# Patient Record
Sex: Female | Born: 1955 | Race: Black or African American | Hispanic: No | Marital: Single | Smoking: Never smoker
Health system: Southern US, Community
[De-identification: ages and names within clinical notes are randomized; demographics above are authoritative.]

## PROBLEM LIST (undated history)

## (undated) DIAGNOSIS — I509 Heart failure, unspecified: Secondary | ICD-10-CM

## (undated) DIAGNOSIS — I82409 Acute embolism and thrombosis of unspecified deep veins of unspecified lower extremity: Secondary | ICD-10-CM

## (undated) DIAGNOSIS — J449 Chronic obstructive pulmonary disease, unspecified: Secondary | ICD-10-CM

## (undated) DIAGNOSIS — I10 Essential (primary) hypertension: Secondary | ICD-10-CM

## (undated) DIAGNOSIS — R0602 Shortness of breath: Secondary | ICD-10-CM

## (undated) DIAGNOSIS — D241 Benign neoplasm of right breast: Secondary | ICD-10-CM

## (undated) DIAGNOSIS — D509 Iron deficiency anemia, unspecified: Secondary | ICD-10-CM

## (undated) DIAGNOSIS — G459 Transient cerebral ischemic attack, unspecified: Secondary | ICD-10-CM

## (undated) DIAGNOSIS — E785 Hyperlipidemia, unspecified: Secondary | ICD-10-CM

## (undated) DIAGNOSIS — J45909 Unspecified asthma, uncomplicated: Secondary | ICD-10-CM

## (undated) DIAGNOSIS — Z531 Procedure and treatment not carried out because of patient's decision for reasons of belief and group pressure: Secondary | ICD-10-CM

## (undated) HISTORY — DX: Benign neoplasm of right breast: D24.1

## (undated) HISTORY — DX: Iron deficiency anemia, unspecified: D50.9

## (undated) HISTORY — PX: EYE SURGERY: SHX253

## (undated) HISTORY — DX: Acute embolism and thrombosis of unspecified deep veins of unspecified lower extremity: I82.409

---

## 2001-09-24 ENCOUNTER — Encounter: Payer: Self-pay | Admitting: Emergency Medicine

## 2001-09-24 ENCOUNTER — Emergency Department (HOSPITAL_COMMUNITY): Admission: EM | Admit: 2001-09-24 | Discharge: 2001-09-24 | Payer: Self-pay | Admitting: *Deleted

## 2002-06-02 ENCOUNTER — Ambulatory Visit (HOSPITAL_COMMUNITY): Admission: RE | Admit: 2002-06-02 | Discharge: 2002-06-02 | Payer: Self-pay | Admitting: Family Medicine

## 2002-06-02 ENCOUNTER — Encounter: Payer: Self-pay | Admitting: Family Medicine

## 2003-05-30 ENCOUNTER — Emergency Department (HOSPITAL_COMMUNITY): Admission: EM | Admit: 2003-05-30 | Discharge: 2003-05-30 | Payer: Self-pay | Admitting: Emergency Medicine

## 2003-08-23 ENCOUNTER — Encounter (INDEPENDENT_AMBULATORY_CARE_PROVIDER_SITE_OTHER): Payer: Self-pay | Admitting: Interventional Cardiology

## 2003-08-23 ENCOUNTER — Inpatient Hospital Stay (HOSPITAL_COMMUNITY): Admission: EM | Admit: 2003-08-23 | Discharge: 2003-08-26 | Payer: Self-pay | Admitting: Emergency Medicine

## 2004-06-23 ENCOUNTER — Inpatient Hospital Stay (HOSPITAL_COMMUNITY): Admission: EM | Admit: 2004-06-23 | Discharge: 2004-06-26 | Payer: Self-pay | Admitting: Emergency Medicine

## 2004-06-30 ENCOUNTER — Ambulatory Visit: Payer: Self-pay | Admitting: Family Medicine

## 2004-07-01 ENCOUNTER — Ambulatory Visit: Payer: Self-pay | Admitting: *Deleted

## 2004-07-14 ENCOUNTER — Ambulatory Visit: Payer: Self-pay | Admitting: Family Medicine

## 2004-07-29 ENCOUNTER — Emergency Department (HOSPITAL_COMMUNITY): Admission: EM | Admit: 2004-07-29 | Discharge: 2004-07-30 | Payer: Self-pay | Admitting: Emergency Medicine

## 2004-07-29 ENCOUNTER — Encounter (INDEPENDENT_AMBULATORY_CARE_PROVIDER_SITE_OTHER): Payer: Self-pay | Admitting: *Deleted

## 2004-08-16 ENCOUNTER — Emergency Department (HOSPITAL_COMMUNITY): Admission: EM | Admit: 2004-08-16 | Discharge: 2004-08-16 | Payer: Self-pay | Admitting: *Deleted

## 2004-08-26 ENCOUNTER — Ambulatory Visit: Payer: Self-pay | Admitting: Family Medicine

## 2004-08-29 ENCOUNTER — Ambulatory Visit (HOSPITAL_COMMUNITY): Admission: RE | Admit: 2004-08-29 | Discharge: 2004-08-29 | Payer: Self-pay | Admitting: Family Medicine

## 2004-09-01 ENCOUNTER — Ambulatory Visit: Payer: Self-pay | Admitting: Family Medicine

## 2004-09-23 DIAGNOSIS — I82409 Acute embolism and thrombosis of unspecified deep veins of unspecified lower extremity: Secondary | ICD-10-CM

## 2004-10-20 ENCOUNTER — Ambulatory Visit: Payer: Self-pay | Admitting: Family Medicine

## 2004-11-04 ENCOUNTER — Ambulatory Visit: Payer: Self-pay | Admitting: Family Medicine

## 2004-11-22 HISTORY — PX: APPENDECTOMY: SHX54

## 2004-11-22 HISTORY — PX: TOTAL ABDOMINAL HYSTERECTOMY W/ BILATERAL SALPINGOOPHORECTOMY: SHX83

## 2005-03-01 IMAGING — US US PELVIS COMPLETE
1 series · 14 of 25 positions shown · non-contrast
Comparison: none

CLINICAL DATA: Fever of unknown origin. 
 ULTRASOUND OF THE PELVIS: 
 Multiple longitudinal and transverse images were obtained. There is a large abdominal and pelvic mass with dystrophic calcification.  It is difficult to determine exactly which structure it arises from but I believe it arises from the uterus.  There are areas of presumed central necrosis.  Considerations would include degenerating fibroid vs. leiomyosarcoma. There is a moderate amount of free fluid now which was not present on the previous CT.  This study represents ascites, bleeding, or serous fluid arising from the mass.  Cross sectional measurements are approximately 21 x 20 x 14 cm.

[Series 1: unknown · 0.45mm/px · 14 of 45 slices shown]
[im 1/45]
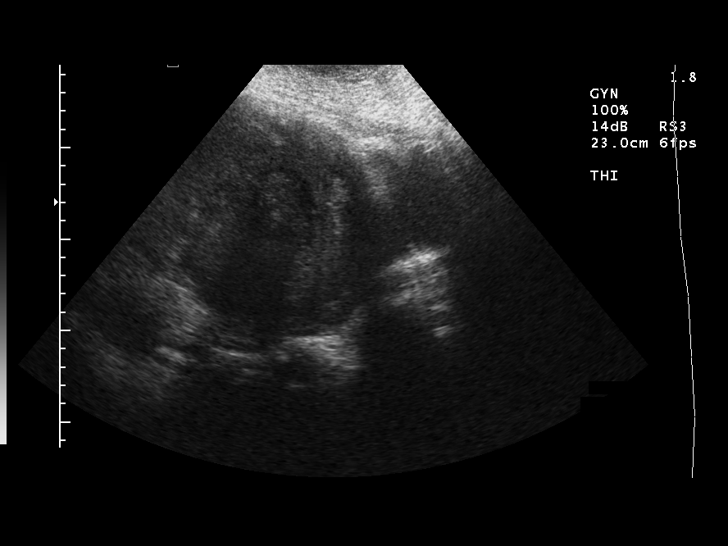
[im 4/45]
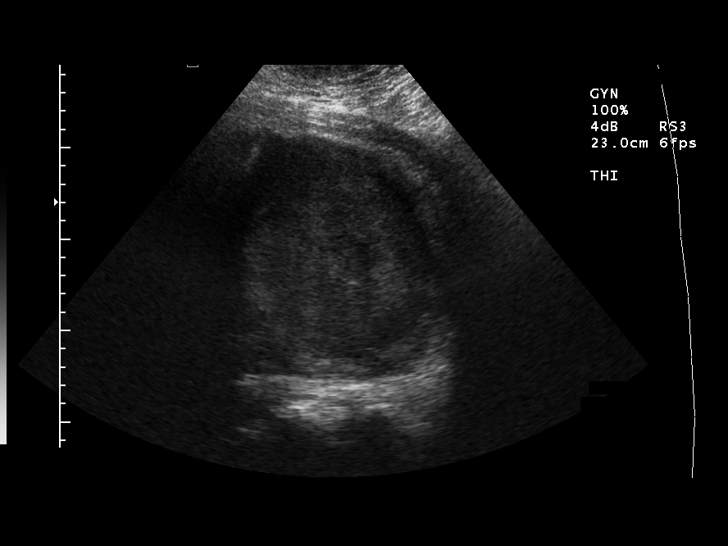
[im 8/45]
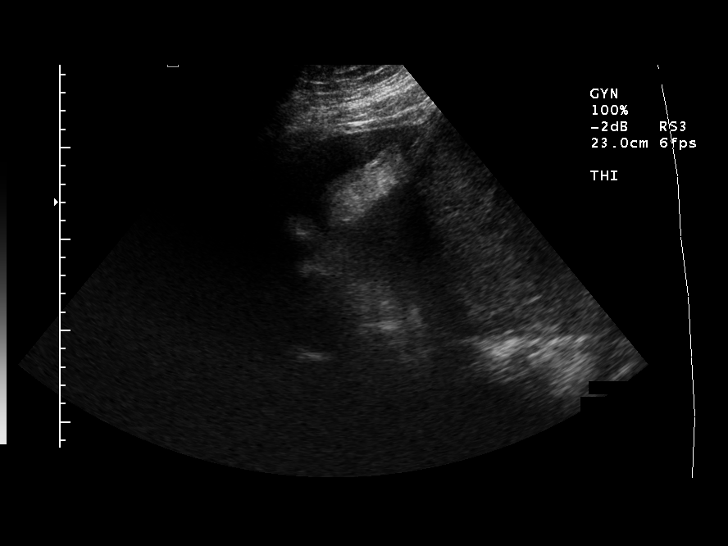
[im 12/45]
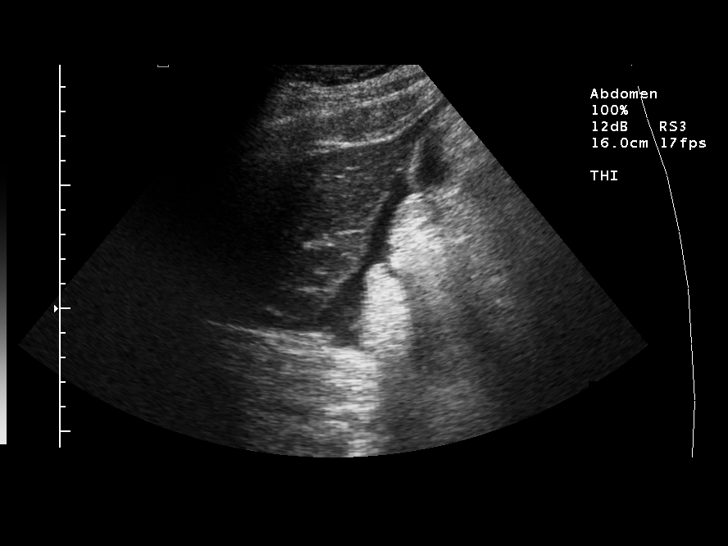
[im 15/45]
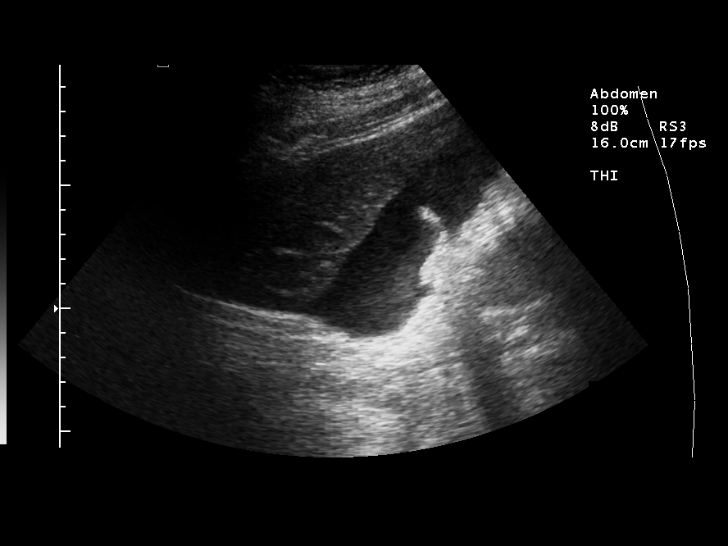
[im 17/45]
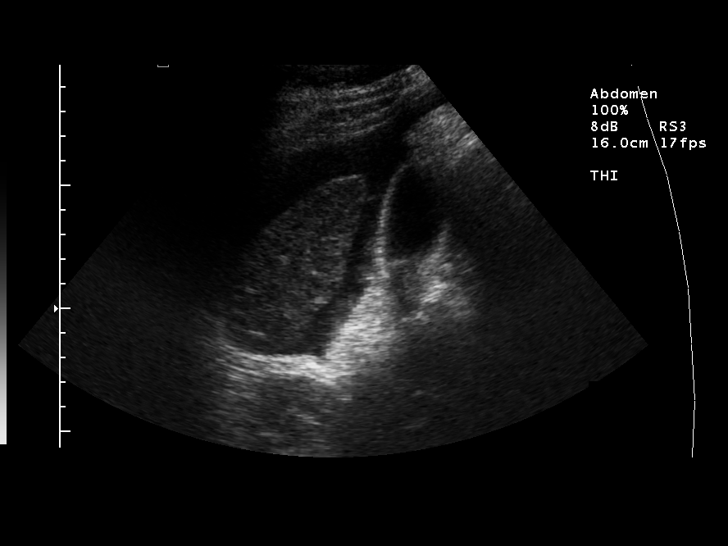
[im 21/45]
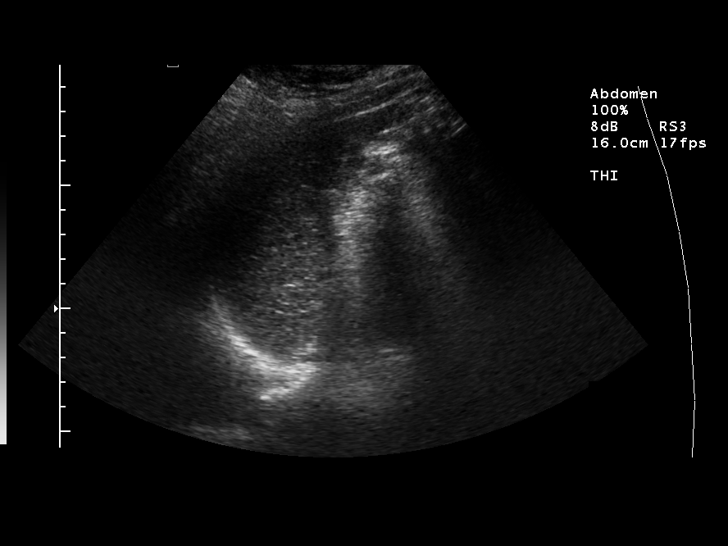
[im 24/45]
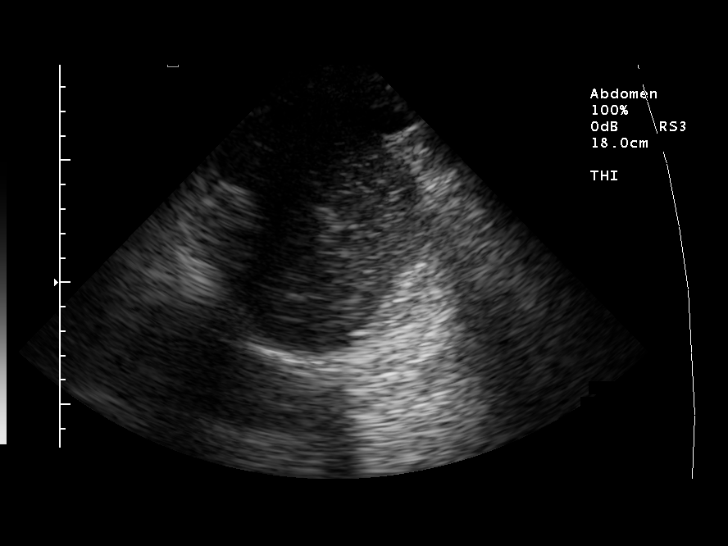
[im 28/45]
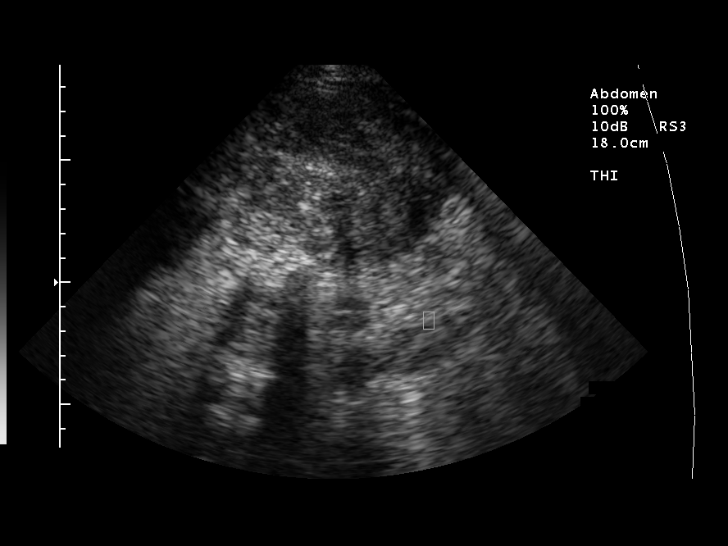
[im 30/45]
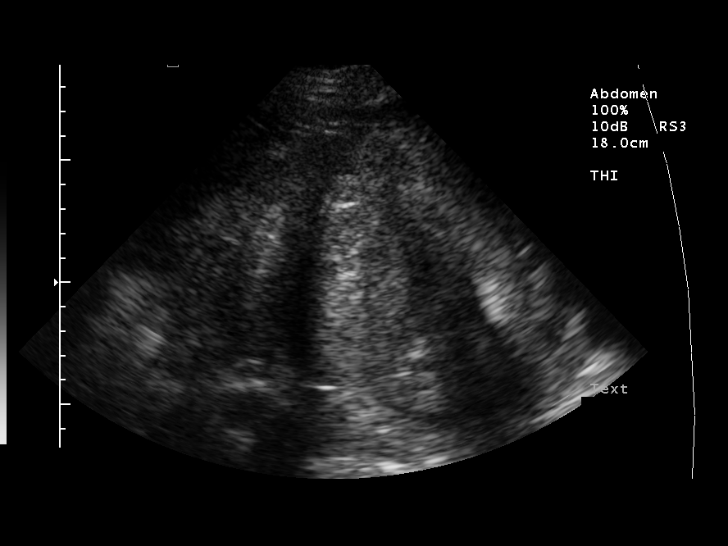
[im 34/45]
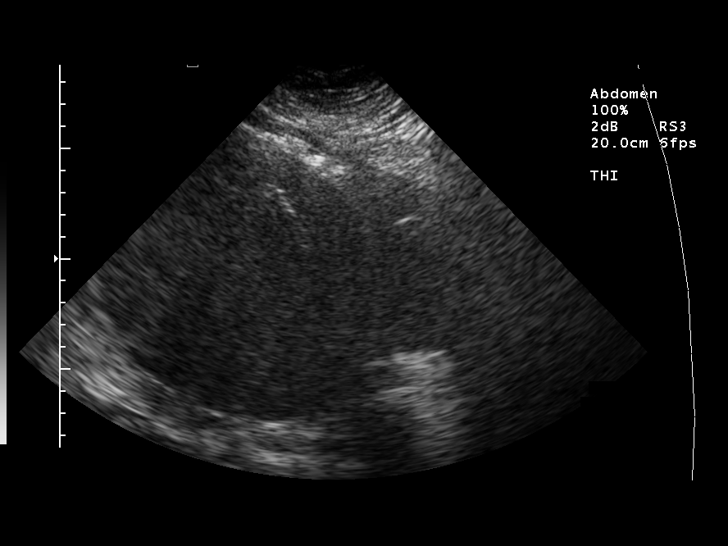
[im 37/45]
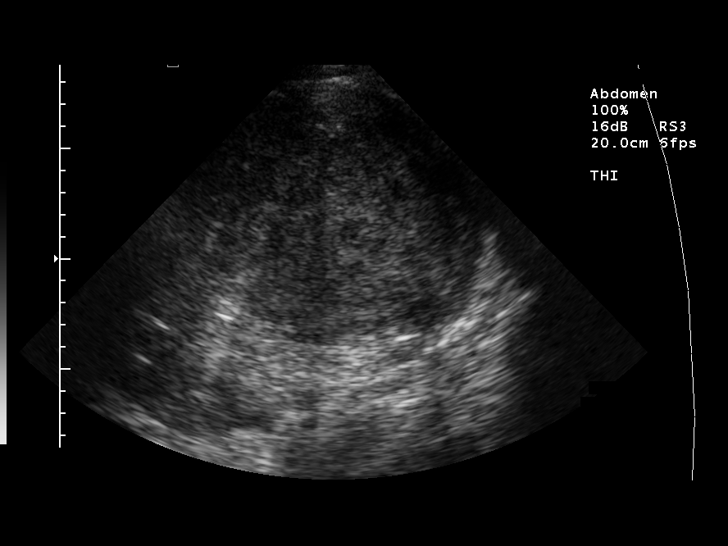
[im 41/45]
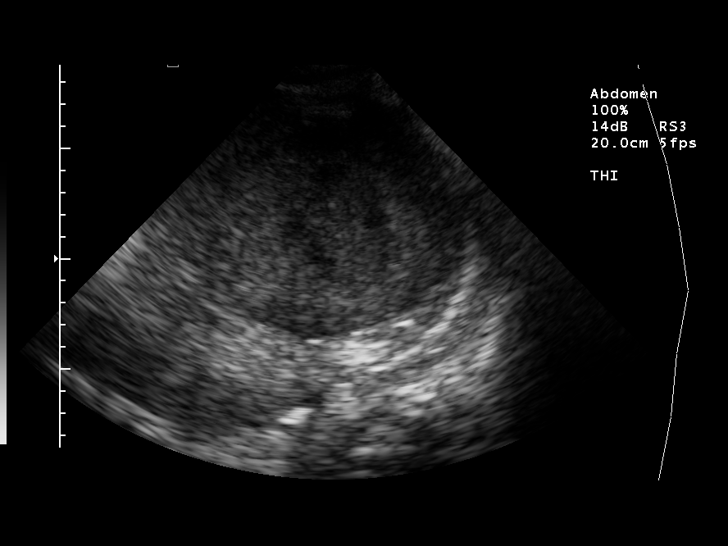
[im 45/45]
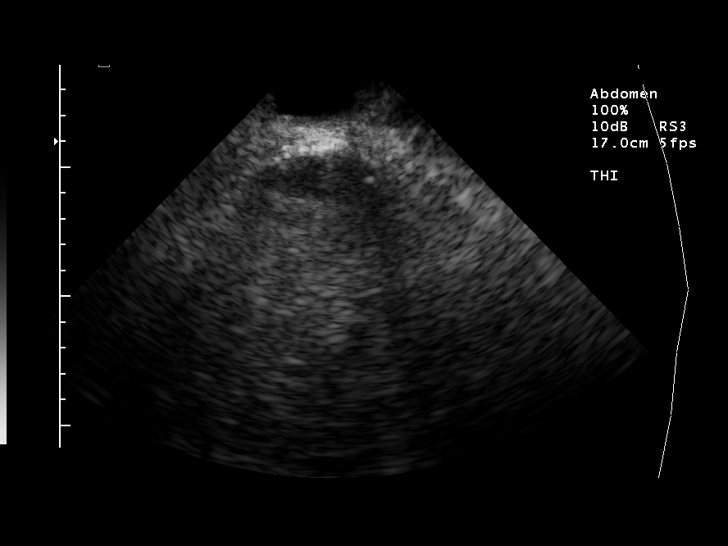

[14 of 25 positions shown; findings below may reference images not displayed]

IMPRESSION: Large abdominal and pelvic mass with calcification and presumed necrosis now associated with free fluid.  Correlate clinically.

## 2005-03-01 IMAGING — CR DG ABDOMEN ACUTE W/ 1V CHEST
3 series · 3 of 3 positions shown · non-contrast
Comparison: none

CLINICAL DATA: Weakness.  Abdominal pain. 
 ACUTE ABDOMINAL SERIES WITH CHEST:
 There are areas of calcification seen within the left abdomen which correspond to the areas of calcification associated with the large pelvic mass extending into the left abdomen (when correlated with the 06/24/04 CT).  The bowel gas pattern is normal.  There is no free peritoneal air.  Degenerative disc space narrowing is noted at the L4-5 level.  The upright chest view included with this study is normal.

[view not recorded (1 of 3)]
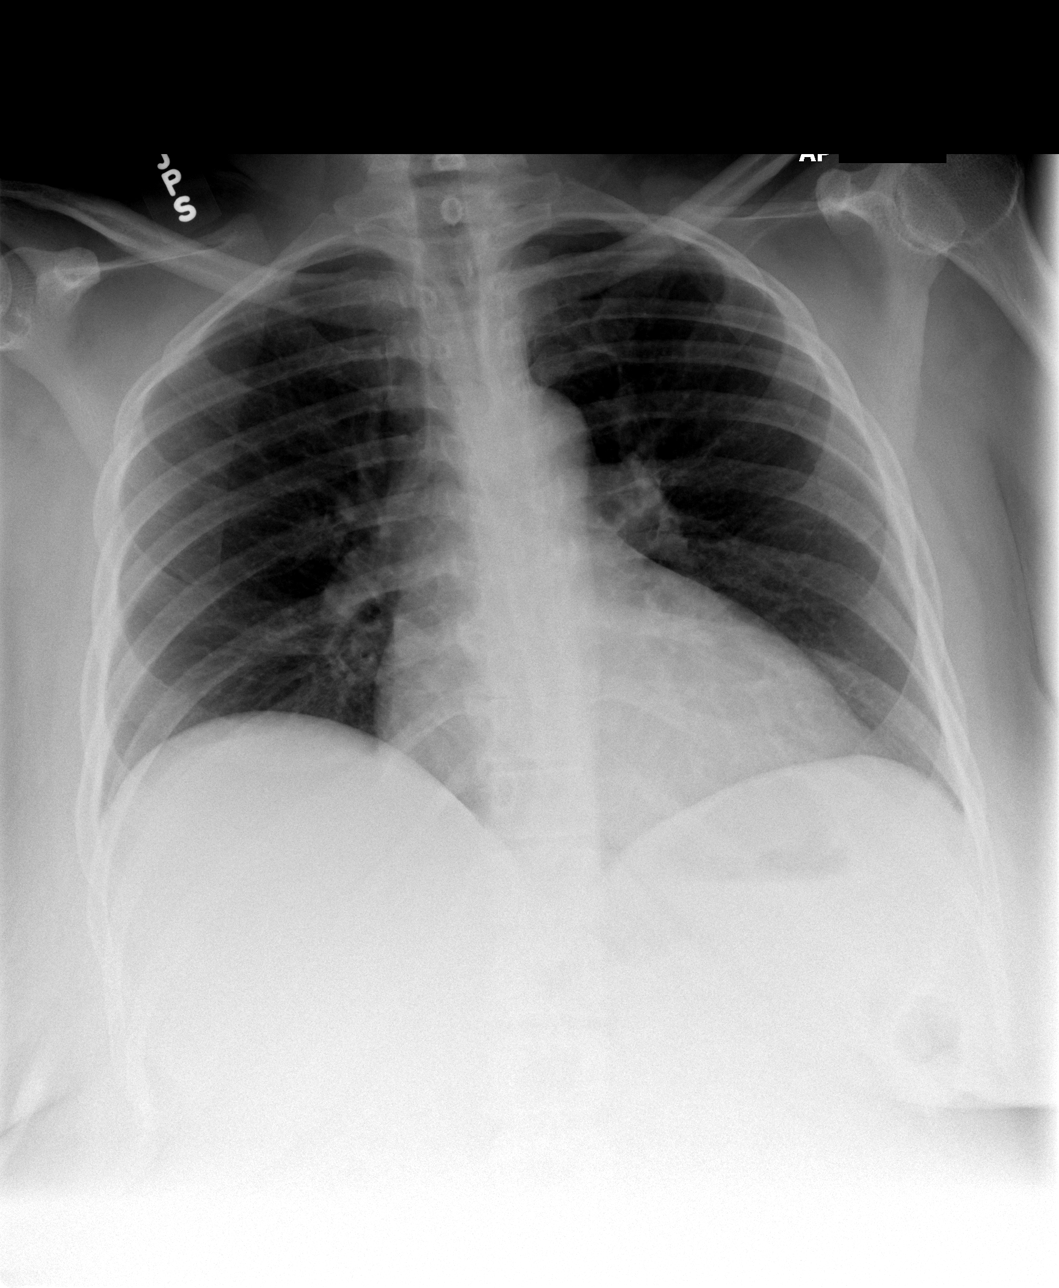

[view not recorded (2 of 3)]
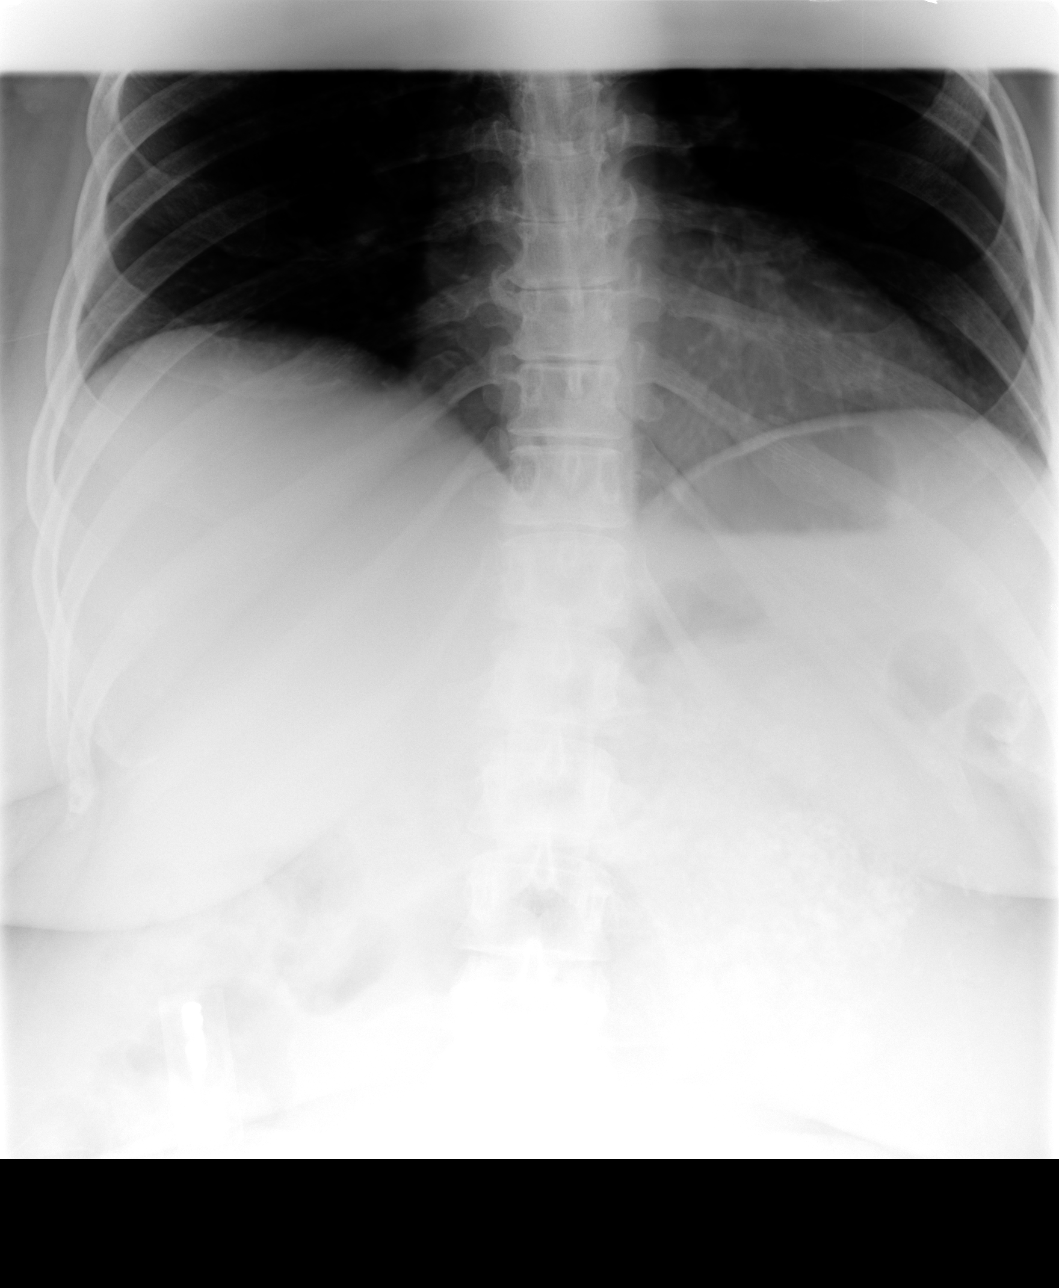

[view not recorded (3 of 3)]
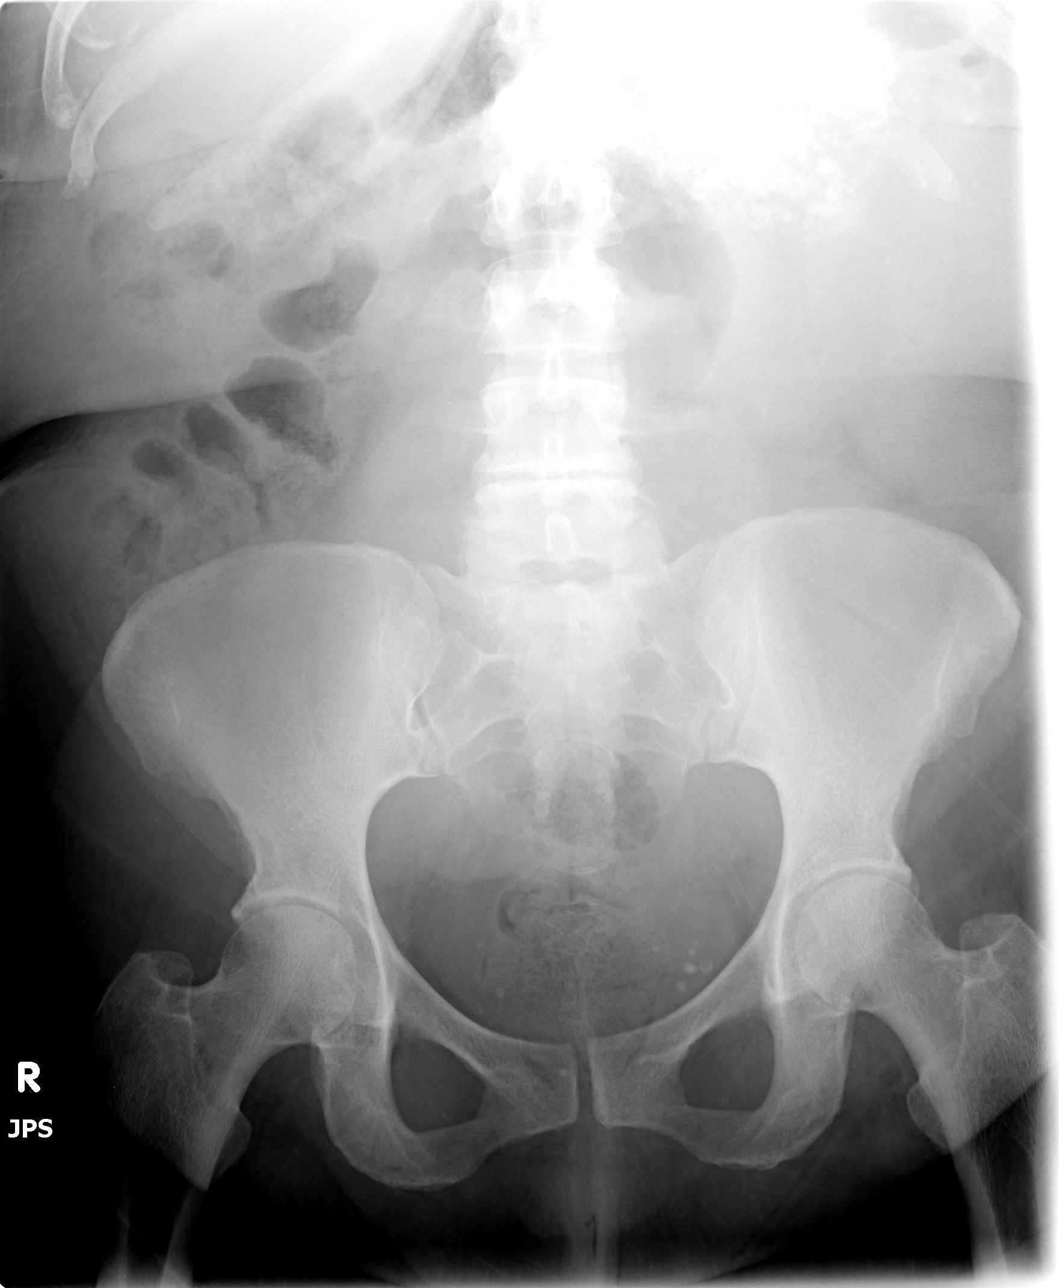

[3 of 3 positions shown; findings below may reference images not displayed]

IMPRESSION: Areas of calcification within the left abdomen related to the large mass extending from the pelvis into the abdomen (noted on the 06/24/04 CT of the abdomen and pelvis. 
 Normal bowel gas pattern.

## 2005-03-06 ENCOUNTER — Ambulatory Visit: Payer: Self-pay | Admitting: Family Medicine

## 2005-03-12 ENCOUNTER — Ambulatory Visit: Payer: Self-pay | Admitting: Family Medicine

## 2005-03-16 ENCOUNTER — Ambulatory Visit: Payer: Self-pay | Admitting: Family Medicine

## 2005-03-19 ENCOUNTER — Ambulatory Visit: Payer: Self-pay | Admitting: Family Medicine

## 2005-03-23 ENCOUNTER — Ambulatory Visit: Payer: Self-pay | Admitting: Family Medicine

## 2005-03-26 ENCOUNTER — Ambulatory Visit: Payer: Self-pay | Admitting: Family Medicine

## 2005-04-02 ENCOUNTER — Ambulatory Visit: Payer: Self-pay | Admitting: Family Medicine

## 2005-04-02 DIAGNOSIS — D509 Iron deficiency anemia, unspecified: Secondary | ICD-10-CM

## 2005-04-02 HISTORY — DX: Iron deficiency anemia, unspecified: D50.9

## 2005-04-13 ENCOUNTER — Ambulatory Visit: Payer: Self-pay | Admitting: Family Medicine

## 2005-04-17 ENCOUNTER — Ambulatory Visit: Payer: Self-pay | Admitting: Family Medicine

## 2005-05-14 ENCOUNTER — Ambulatory Visit: Payer: Self-pay | Admitting: Family Medicine

## 2005-06-11 ENCOUNTER — Ambulatory Visit: Payer: Self-pay | Admitting: Family Medicine

## 2005-08-17 ENCOUNTER — Emergency Department (HOSPITAL_COMMUNITY): Admission: EM | Admit: 2005-08-17 | Discharge: 2005-08-18 | Payer: Self-pay | Admitting: Emergency Medicine

## 2005-08-19 ENCOUNTER — Emergency Department (HOSPITAL_COMMUNITY): Admission: EM | Admit: 2005-08-19 | Discharge: 2005-08-19 | Payer: Self-pay | Admitting: Family Medicine

## 2005-08-20 ENCOUNTER — Emergency Department (HOSPITAL_COMMUNITY): Admission: EM | Admit: 2005-08-20 | Discharge: 2005-08-20 | Payer: Self-pay | Admitting: Emergency Medicine

## 2005-08-22 ENCOUNTER — Emergency Department (HOSPITAL_COMMUNITY): Admission: EM | Admit: 2005-08-22 | Discharge: 2005-08-22 | Payer: Self-pay | Admitting: Emergency Medicine

## 2007-12-07 ENCOUNTER — Inpatient Hospital Stay (HOSPITAL_COMMUNITY): Admission: EM | Admit: 2007-12-07 | Discharge: 2007-12-10 | Payer: Self-pay | Admitting: Emergency Medicine

## 2008-10-24 ENCOUNTER — Emergency Department (HOSPITAL_COMMUNITY): Admission: EM | Admit: 2008-10-24 | Discharge: 2008-10-24 | Payer: Self-pay | Admitting: Emergency Medicine

## 2009-02-08 ENCOUNTER — Emergency Department (HOSPITAL_COMMUNITY): Admission: EM | Admit: 2009-02-08 | Discharge: 2009-02-08 | Payer: Self-pay | Admitting: Emergency Medicine

## 2009-02-18 ENCOUNTER — Ambulatory Visit: Payer: Self-pay | Admitting: Vascular Surgery

## 2009-02-18 ENCOUNTER — Encounter (INDEPENDENT_AMBULATORY_CARE_PROVIDER_SITE_OTHER): Payer: Self-pay | Admitting: Emergency Medicine

## 2009-02-18 ENCOUNTER — Ambulatory Visit: Admission: RE | Admit: 2009-02-18 | Discharge: 2009-02-18 | Payer: Self-pay | Admitting: Emergency Medicine

## 2009-02-18 ENCOUNTER — Emergency Department (HOSPITAL_COMMUNITY): Admission: EM | Admit: 2009-02-18 | Discharge: 2009-02-18 | Payer: Self-pay | Admitting: Emergency Medicine

## 2009-02-21 DIAGNOSIS — D241 Benign neoplasm of right breast: Secondary | ICD-10-CM

## 2009-02-21 HISTORY — DX: Benign neoplasm of right breast: D24.1

## 2009-07-18 ENCOUNTER — Emergency Department (HOSPITAL_COMMUNITY): Admission: EM | Admit: 2009-07-18 | Discharge: 2009-07-19 | Payer: Self-pay | Admitting: Emergency Medicine

## 2009-11-22 ENCOUNTER — Inpatient Hospital Stay (HOSPITAL_COMMUNITY): Admission: EM | Admit: 2009-11-22 | Discharge: 2009-11-24 | Payer: Self-pay | Admitting: Emergency Medicine

## 2010-03-02 ENCOUNTER — Emergency Department (HOSPITAL_COMMUNITY): Admission: EM | Admit: 2010-03-02 | Discharge: 2010-03-02 | Payer: Self-pay | Admitting: Emergency Medicine

## 2010-04-29 ENCOUNTER — Emergency Department (HOSPITAL_COMMUNITY): Admission: EM | Admit: 2010-04-29 | Discharge: 2010-04-29 | Payer: Self-pay | Admitting: Emergency Medicine

## 2010-09-01 ENCOUNTER — Emergency Department (HOSPITAL_COMMUNITY)
Admission: EM | Admit: 2010-09-01 | Discharge: 2010-09-02 | Payer: Self-pay | Source: Home / Self Care | Admitting: Emergency Medicine

## 2010-10-07 ENCOUNTER — Emergency Department (HOSPITAL_COMMUNITY): Payer: Medicaid Other

## 2010-10-07 ENCOUNTER — Emergency Department (HOSPITAL_COMMUNITY)
Admission: EM | Admit: 2010-10-07 | Discharge: 2010-10-07 | Disposition: A | Discharge status: Home / Self Care | Payer: Medicaid Other | Attending: Emergency Medicine | Admitting: Emergency Medicine

## 2010-10-07 DIAGNOSIS — J45909 Unspecified asthma, uncomplicated: Secondary | ICD-10-CM | POA: Insufficient documentation

## 2010-10-07 DIAGNOSIS — R0602 Shortness of breath: Secondary | ICD-10-CM | POA: Insufficient documentation

## 2010-10-07 DIAGNOSIS — E119 Type 2 diabetes mellitus without complications: Secondary | ICD-10-CM | POA: Insufficient documentation

## 2010-10-07 DIAGNOSIS — I10 Essential (primary) hypertension: Secondary | ICD-10-CM | POA: Insufficient documentation

## 2010-10-07 DIAGNOSIS — Z794 Long term (current) use of insulin: Secondary | ICD-10-CM | POA: Insufficient documentation

## 2010-10-12 ENCOUNTER — Emergency Department (HOSPITAL_COMMUNITY)
Admission: EM | Admit: 2010-10-12 | Discharge: 2010-10-12 | Disposition: A | Discharge status: Home / Self Care | Payer: Medicaid Other | Attending: Emergency Medicine | Admitting: Emergency Medicine

## 2010-10-12 DIAGNOSIS — E7800 Pure hypercholesterolemia, unspecified: Secondary | ICD-10-CM | POA: Insufficient documentation

## 2010-10-12 DIAGNOSIS — R0602 Shortness of breath: Secondary | ICD-10-CM | POA: Insufficient documentation

## 2010-10-12 DIAGNOSIS — I10 Essential (primary) hypertension: Secondary | ICD-10-CM | POA: Insufficient documentation

## 2010-10-12 DIAGNOSIS — R059 Cough, unspecified: Secondary | ICD-10-CM | POA: Insufficient documentation

## 2010-10-12 DIAGNOSIS — J45901 Unspecified asthma with (acute) exacerbation: Secondary | ICD-10-CM | POA: Insufficient documentation

## 2010-10-12 DIAGNOSIS — E119 Type 2 diabetes mellitus without complications: Secondary | ICD-10-CM | POA: Insufficient documentation

## 2010-10-12 DIAGNOSIS — R05 Cough: Secondary | ICD-10-CM | POA: Insufficient documentation

## 2010-10-12 LAB — BASIC METABOLIC PANEL
BUN: 19 mg/dL (ref 6–23)
CO2: 27 mEq/L (ref 19–32)
Calcium: 9.7 mg/dL (ref 8.4–10.5)
Chloride: 98 mEq/L (ref 96–112)
Creatinine, Ser: 1.26 mg/dL — ABNORMAL HIGH (ref 0.4–1.2)
GFR calc Af Amer: 53 mL/min — ABNORMAL LOW (ref >60)
Glucose, Bld: 235 mg/dL — ABNORMAL HIGH (ref 70–99)
Sodium: 135 mEq/L (ref 135–145)

## 2010-10-12 LAB — CBC
MCH: 28.2 pg (ref 26.0–34.0)
Platelets: 317 10*3/uL (ref 150–400)
RBC: 4.61 MIL/uL (ref 3.87–5.11)
WBC: 11 10*3/uL — ABNORMAL HIGH (ref 4.0–10.5)

## 2010-10-12 LAB — DIFFERENTIAL
Basophils Absolute: 0 10*3/uL (ref 0.0–0.1)
Basophils Relative: 0 % (ref 0–1)
Eosinophils Absolute: 0.7 10*3/uL (ref 0.0–0.7)
Eosinophils Relative: 6 % — ABNORMAL HIGH (ref 0–5)
Lymphs Abs: 3.8 10*3/uL (ref 0.7–4.0)
Monocytes Relative: 6 % (ref 3–12)
Neutrophils Relative %: 53 % (ref 43–77)

## 2010-10-12 LAB — BRAIN NATRIURETIC PEPTIDE: Pro B Natriuretic peptide (BNP): <30 pg/mL (ref 0.0–100.0)

## 2010-11-06 LAB — POCT CARDIAC MARKERS
CKMB, poc: 4.6 ng/mL (ref 1.0–8.0)
Myoglobin, poc: 241 ng/mL (ref 12–200)
Troponin i, poc: <0.05 ng/mL (ref 0.00–0.09)

## 2010-11-06 LAB — POCT I-STAT, CHEM 8
BUN: 20 mg/dL (ref 6–23)
Calcium, Ion: 1.04 mmol/L — ABNORMAL LOW (ref 1.12–1.32)
Chloride: 103 mEq/L (ref 96–112)
Creatinine, Ser: 1.4 mg/dL — ABNORMAL HIGH (ref 0.4–1.2)
Glucose, Bld: 140 mg/dL — ABNORMAL HIGH (ref 70–99)
HCT: 40 % (ref 36.0–46.0)
Hemoglobin: 13.6 g/dL (ref 12.0–15.0)
Potassium: 4.6 mEq/L (ref 3.5–5.1)
Sodium: 140 mEq/L (ref 135–145)
TCO2: 32 mmol/L (ref 0–100)

## 2010-11-12 LAB — BASIC METABOLIC PANEL
BUN: 15 mg/dL (ref 6–23)
BUN: 21 mg/dL (ref 6–23)
BUN: 29 mg/dL — ABNORMAL HIGH (ref 6–23)
CO2: 27 mEq/L (ref 19–32)
CO2: 29 mEq/L (ref 19–32)
Calcium: 8.6 mg/dL (ref 8.4–10.5)
Calcium: 8.8 mg/dL (ref 8.4–10.5)
Chloride: 102 mEq/L (ref 96–112)
Chloride: 96 mEq/L (ref 96–112)
Creatinine, Ser: 1.06 mg/dL (ref 0.4–1.2)
Creatinine, Ser: 1.2 mg/dL (ref 0.4–1.2)
GFR calc Af Amer: 56 mL/min — ABNORMAL LOW (ref >60)
GFR calc Af Amer: >60 mL/min (ref >60)
GFR calc non Af Amer: 46 mL/min — ABNORMAL LOW (ref >60)
GFR calc non Af Amer: 54 mL/min — ABNORMAL LOW (ref >60)
Glucose, Bld: 123 mg/dL — ABNORMAL HIGH (ref 70–99)
Glucose, Bld: 343 mg/dL — ABNORMAL HIGH (ref 70–99)
Potassium: 4.3 mEq/L (ref 3.5–5.1)
Potassium: 4.5 mEq/L (ref 3.5–5.1)
Sodium: 133 mEq/L — ABNORMAL LOW (ref 135–145)
Sodium: 137 mEq/L (ref 135–145)

## 2010-11-12 LAB — GLUCOSE, CAPILLARY
Glucose-Capillary: 458 mg/dL — ABNORMAL HIGH (ref 70–99)
Glucose-Capillary: 540 mg/dL — ABNORMAL HIGH (ref 70–99)

## 2010-11-12 LAB — CBC
HCT: 32.8 % — ABNORMAL LOW (ref 36.0–46.0)
HCT: 33.5 % — ABNORMAL LOW (ref 36.0–46.0)
HCT: 34.7 % — ABNORMAL LOW (ref 36.0–46.0)
Hemoglobin: 11 g/dL — ABNORMAL LOW (ref 12.0–15.0)
Hemoglobin: 11 g/dL — ABNORMAL LOW (ref 12.0–15.0)
MCHC: 31.6 g/dL (ref 30.0–36.0)
MCHC: 31.8 g/dL (ref 30.0–36.0)
MCHC: 32.7 g/dL (ref 30.0–36.0)
MCV: 88.1 fL (ref 78.0–100.0)
MCV: 88.2 fL (ref 78.0–100.0)
Platelets: 352 10*3/uL (ref 150–400)
Platelets: 355 10*3/uL (ref 150–400)
Platelets: 369 10*3/uL (ref 150–400)
RBC: 3.94 MIL/uL (ref 3.87–5.11)
RDW: 14.7 % (ref 11.5–15.5)
RDW: 14.9 % (ref 11.5–15.5)
RDW: 15 % (ref 11.5–15.5)
WBC: 11.2 10*3/uL — ABNORMAL HIGH (ref 4.0–10.5)
WBC: 15.1 10*3/uL — ABNORMAL HIGH (ref 4.0–10.5)

## 2010-11-12 LAB — CARDIAC PANEL(CRET KIN+CKTOT+MB+TROPI)
CK, MB: 5.8 ng/mL — ABNORMAL HIGH (ref 0.3–4.0)
CK, MB: 6 ng/mL — ABNORMAL HIGH (ref 0.3–4.0)
Relative Index: 0.7 (ref 0.0–2.5)
Total CK: 661 U/L — ABNORMAL HIGH (ref 7–177)
Total CK: 811 U/L — ABNORMAL HIGH (ref 7–177)
Total CK: 813 U/L — ABNORMAL HIGH (ref 7–177)
Troponin I: 0.02 ng/mL (ref 0.00–0.06)
Troponin I: 0.03 ng/mL (ref 0.00–0.06)
Troponin I: 0.04 ng/mL (ref 0.00–0.06)

## 2010-11-12 LAB — DIFFERENTIAL
Basophils Relative: 0 % (ref 0–1)
Lymphs Abs: 3.4 10*3/uL (ref 0.7–4.0)
Monocytes Absolute: 0.6 10*3/uL (ref 0.1–1.0)
Monocytes Relative: 5 % (ref 3–12)
Neutro Abs: 6.6 10*3/uL (ref 1.7–7.7)
Neutrophils Relative %: 59 % (ref 43–77)

## 2010-11-12 LAB — POCT CARDIAC MARKERS
CKMB, poc: 5 ng/mL (ref 1.0–8.0)
Myoglobin, poc: 206 ng/mL (ref 12–200)

## 2010-11-12 LAB — GLUCOSE, RANDOM: Glucose, Bld: 552 mg/dL (ref 70–99)

## 2010-11-12 LAB — HEMOGLOBIN A1C: Hgb A1c MFr Bld: 8.7 % — ABNORMAL HIGH (ref 4.6–6.1)

## 2010-11-12 LAB — BRAIN NATRIURETIC PEPTIDE: Pro B Natriuretic peptide (BNP): 85.8 pg/mL (ref 0.0–100.0)

## 2010-11-23 ENCOUNTER — Emergency Department (HOSPITAL_COMMUNITY): Payer: Medicaid Other

## 2010-11-23 ENCOUNTER — Emergency Department (HOSPITAL_COMMUNITY)
Admission: EM | Admit: 2010-11-23 | Discharge: 2010-11-23 | Disposition: A | Discharge status: Home / Self Care | Payer: Medicaid Other | Attending: Emergency Medicine | Admitting: Emergency Medicine

## 2010-11-23 DIAGNOSIS — Z794 Long term (current) use of insulin: Secondary | ICD-10-CM | POA: Insufficient documentation

## 2010-11-23 DIAGNOSIS — E119 Type 2 diabetes mellitus without complications: Secondary | ICD-10-CM | POA: Insufficient documentation

## 2010-11-23 DIAGNOSIS — I10 Essential (primary) hypertension: Secondary | ICD-10-CM | POA: Insufficient documentation

## 2010-11-23 DIAGNOSIS — J45909 Unspecified asthma, uncomplicated: Secondary | ICD-10-CM | POA: Insufficient documentation

## 2010-12-01 LAB — GLUCOSE, CAPILLARY: Glucose-Capillary: 111 mg/dL — ABNORMAL HIGH (ref 70–99)

## 2011-01-06 NOTE — H&P (Signed)
[name], [name] NO.:  [id number]   MEDICAL RECORD NO.:  [id number]          PATIENT TYPE:  INP   LOCATION:  0104                         FACILITY:  [facility]   PHYSICIAN:  Della Goo, M.D. DATE OF BIRTH:  [birth year]   DATE OF ADMISSION:  12/06/2007  DATE OF DISCHARGE:                              HISTORY & PHYSICAL   PRIMARY CARE PHYSICIAN:  Unassigned.   CHIEF COMPLAINT:  Shortness of breath.   HISTORY OF PRESENT ILLNESS:  This is a 55 year old female presenting to  the emergency department secondary to worsening shortness of breath.  She reports having cough and chest congestion and wheezes for 2 weeks.  She also reports having fevers, chills off and on.  She reports coughing  up yellowish phlegm.  She denies having any nausea, vomiting, chest  pain, syncope, dizziness.   PAST MEDICAL HISTORY:  1. Hypertension.  2. Insulin-dependent diabetes mellitus.  3. Asthma.   PAST SURGICAL HISTORY:  History of a total abdominal hysterectomy.   MEDICATIONS:  1. Norvasc 5 mg one p.o. daily.  2. Toprol XL 100 mg one p.o. daily.  3. Accupril 40 mg one p.o. daily.  4. Dyazide 37.5/25 mg one p.o. daily.  5. Proventil inhaler 2 puffs q.i.d. p.r.n.  6. Lantus insulin 85 units subcutaneous nightly.  7. NovoLog regular insulin 4 units subcutaneous t.i.d. with meals.   ALLERGIES:  AMOXICILLIN.   SOCIAL HISTORY:  The patient is a nonsmoker, nondrinker.   FAMILY HISTORY:  Positive for hypertension in her brother and both  parents.  Positive for diabetes mellitus in her mother.  Positive for  coronary artery disease in both of her parents and positive for cancer  in her father, who had bone cancer.   REVIEW OF SYSTEMS:  Pertinents are mentioned above.   PHYSICAL EXAMINATION FINDINGS:  This is a 55 year old morbidly obese  female in discomfort and mild distress.  VITAL SIGNS:  Temperature 99.2, blood pressure 139/70, heart rate 97,  respirations 22, O2  saturation 93-94% on 2 L nasal cannula oxygen.  HEENT:  Normocephalic, atraumatic.  Pupils equally round, reactive to  light.  Extraocular muscles are intact.  Funduscopic benign.  Oropharynx  is clear.  NECK:  Supple, full range of motion.  No thyromegaly, adenopathy,  jugulovenous distention.  CARDIOVASCULAR:  Regular rate and rhythm.  No murmurs, gallops or rubs.  LUNGS:  Lungs with decreased breath sounds and diffuse expiratory  wheezes.  ABDOMEN:  Positive bowel sounds, soft, nontender, nondistended.  EXTREMITIES:  Without cyanosis, clubbing or edema.  NEUROLOGIC:  Alert and oriented x3.  There are no focal deficits.   LABORATORY STUDIES:  White blood cell count 12.1, hemoglobin 12.1,  hematocrit 37.8, platelets 290, MCV 84.3, neutrophils 68% lymphocytes  22%.  Chemistry with a sodium of 129, potassium 3.8, chloride 93, bicarb  24, BUN 29, creatinine 1.61 and glucose 365.  Chest x-ray findings  reveal left greater than right patchy infiltrate and infrahilar airspace  disease opacity suggestive of atelectasis versus pneumonia.   ASSESSMENT:  A 55 year old female being admitted with:   1. Shortness of breath.  2. Pneumonia.  3. Asthma exacerbation.  4. Hypoxemia.  5. Hyperglycemia.  6. Mild dehydration.  7. Chronic renal insufficiency with acute renal failure.  8. Morbid obesity.   PLAN:  The patient will be admitted to a step-down unit area for  cardiopulmonary monitoring.  Supplemental oxygen has been ordered along  with IV fluids and IV antibiotic therapy of Avelox.  The patient will be  placed on an IV steroid taper.  Her regular medications will be  continued and sliding scale insulin coverage has also been ordered.  DVT  and GI prophylaxis have also been ordered.      Della Goo, M.D.  Electronically Signed     HJ/MEDQ  D:  12/07/2007  T:  12/07/2007  Job:  [id number]

## 2011-01-06 NOTE — Discharge Summary (Signed)
[name], [name] NO.:  [id number]   MEDICAL RECORD NO.:  [id number]          PATIENT TYPE:  INP   LOCATION:  1507                         FACILITY:  [facility]   PHYSICIAN:  Altha Harm, MDDATE OF BIRTH:  09-Nov-1955   DATE OF ADMISSION:  12/06/2007  DATE OF DISCHARGE:  12/10/2007                               DISCHARGE SUMMARY   DISCHARGE DISPOSITION:  Home.   FINAL DISCHARGE DIAGNOSES:  1. Community-acquired pneumonia.  2. Acute exacerbation of asthma.  3. Diabetes type 2.  Blood sugars previously uncontrolled, now      controlled.  4. Hypertension.   DISCHARGE MEDICATIONS:  1. Accupril 40 mg p.o. daily.  2. Norvasc 5 mg p.o. daily.  3. Dyazide 37.5/25 mg p.o. daily.  4. Toprol XL 100 mg p.o. daily.  5. Lantus 95 units subcu q.h.s.  6. NovoLog 4 units subcu a.c. meals.  7. Prednisone taper.  8. Doxycycline 100 mg p.o. b.i.d. x3 days.   CONSULTANTS:  None.   PROCEDURES:  None.   DIAGNOSTIC STUDIES:  Chest x-ray 2-view which shows left-greater-than-  right opacity, patchy infiltrate air space opacity with shallow lung  volumes.  Differential includes atelectasis versus pneumonia or  aspiration.   CODE STATUS:  Full code.   ALLERGIES:  No known drug allergies.   CHIEF COMPLAINT:  Shortness of breath.   HISTORY OF PRESENT ILLNESS:  Please see the H&P by Dr. Lovell Sheehan for  details of the HPI; however, in short, this is a 55 year old lady with a  history of asthma who presented with shortness of breath and wheezing  for 2 weeks.  The patient also had been feverish with chills  intermittently.   HOSPITAL COURSE:  1. Pneumonia.  The patient was treated with Avelox while hospitalized      and will be discharged on doxycycline for an additional 3 days to      complete a course of 7 days of antibiotics.  The patient was      initially hypoxic and the patient has now resolved her hypoxia.      She is 90% on room air resting and 89% on room air  with ambulation.  2. Acute exacerbation of asthma secondary to number one.  The patient      has been treated with beta agonist and prednisone.  She is being      sent home with a prednisone taper.  She is to continue her      Proventil inhaler at home as needed.  3. Hypertension.  Blood pressures were well-controlled while      hospitalized.  4. Diabetes type 2 with hyperglycemia.  Due to the use of steroids in      the treatment of the patient's condition, the patient's blood      sugars escalated.  The patient had her insulin adjusted from 85      units to 95 units subcu q.h.s.  The patient checks her blood sugars      at home and has been instructed to adjust her Lantus back down to  85 after she is off of the steroids.   FOLLOWUP:  The patient is to follow up with her primary care physician  at [facility] in 1 week.   DIETARY RESTRICTIONS:  The patient should be on a heart-healthy diabetic  diet.   PHYSICAL RESTRICTIONS:  None.      Altha Harm, MD  Electronically Signed     MAM/MEDQ  D:  12/10/2007  T:  12/10/2007  Job:  [id number]

## 2011-01-09 NOTE — H&P (Signed)
[name], [name]                           ACCOUNT NO.:  [id number]   MEDICAL RECORD NO.:  [id number]                   PATIENT TYPE:  EMS   LOCATION:  ED                                   FACILITY:  [facility]   PHYSICIAN:  Corinna L. Lendell Caprice, MD             DATE OF BIRTH:  02-05-1956   DATE OF ADMISSION:  08/23/2003  DATE OF DISCHARGE:                                HISTORY & PHYSICAL   CHIEF COMPLAINT:  Shortness of breath and leg swelling.   HISTORY OF PRESENT ILLNESS:  [name] is a 55 year old black female who had  not seen a doctor for several years who presents to the emergency room with  complaints of shortness of breath and leg swelling. This has been going on  for the past week. Her dyspnea is mainly on exertion. She has no history of  heart disease or congestive heart failure. She has had no chest pain. She  denies orthopnea or paroxysmal nocturnal dyspnea. She has a history of  diabetes but stopped taking her medicine about a year ago. She was noted to  have high blood pressure in the past but apparently was never started on any  antihypertensive medications.   PAST MEDICAL HISTORY:  Type 2 diabetes, uterine fibroids.   MEDICATIONS:  None.   ALLERGIES:  None.   SOCIAL HISTORY:  The patient does not smoke or drink or use drugs.   FAMILY HISTORY:  Her mother had congestive heart failure. Her father died of  bone cancer.   REVIEW OF SYSTEMS:  CONSTITUTIONAL:  No fevers, chills, weight loss. HEENT:  No headache. No sore throat. RESPIRATORY:  As above. CARDIOVASCULAR:  As  above. GASTROINTESTINAL:  No history of bleeding ulcers. No melena or  hematochezia. GENITOURINARY:  She suffers from heavy periods and has been  bleeding for the past 2 weeks. She was told in the past that she had uterine  fibroids. She has had a 1 single abnormal pap smear many years ago but  subsequent pap smears have been normal. She has not had a recent pap smear  for years. MUSCULOSKELETAL:   No arthralgias or myalgias. SKIN:  No rash.  ENDOCRINE:  As above. No history of thyroid disease. NEUROLOGIC:  No  seizures. PSYCHIATRIC:  No depression.   PHYSICAL EXAMINATION:  VITAL SIGNS:  Temperature normal. Blood pressure  210/95. Heart rate 102. Respiratory rate initially was 34. Currently it is  24.  GENERAL:  The patient is an overweight black female in no acute distress.  HEENT:  Normocephalic and atraumatic. Pupils are equal, round, and reactive  to light. Sclera nonicteric. She has poor dentition.  NECK:  Supple. Difficult to assess for jugular venous distention. She has no  thyromegaly and no carotid bruits.  LUNGS:  Clear to auscultation bilaterally without wheezes, rhonchi, or  rales.  CARDIOVASCULAR:  Regular rate and rhythm. Without murmur, rub, or gallop.  ABDOMEN:  Obese, soft, nontender.  GU/RECTAL:  Deferred.  EXTREMITIES:  She has 1+ pitting edema, left greater than right. Pulses are  intact.  SKIN: No rash.  PSYCHIATRIC:  Normal affect.  NEUROLOGIC:  Alert and oriented. Cranial nerves and sensory motor  examination are intact.   LABORATORY DATA:  Brain natriuretic peptide is 115. CBC is significant for a  hemoglobin of 6.7, hematocrit of 21.8. MCV is 72.8. Coagulation panel  normal. Comprehensive metabolic panel is significant for a glucose of 158,  otherwise essentially unremarkable.   EKG shows normal sinus rhythm.   Chest x-ray shows cardiomegaly and congestive heart failure.   PVL of the left leg showed no deep vein thrombosis.   ASSESSMENT/PLAN:  1. Congestive heart failure possibly diastolic dysfunction. The patient has     been given Lasix and has already diuresed several liters. She is feeling     better. She will be admitted to telemetry. I will check an echocardiogram     and rule out  myocardial infarction. She will get a TSH drawn. Blood     pressure control is key here. She will get Nitro paste and ace inhibitors     and p.r.n. Clonidine.  Certainly, her anemia could be contributing. She     refused blood transfusion, as she is Jehovah's Witness.  2. Microcytic anemia, most likely iron deficiency, secondary to menorrhagia.     The patient will get a ferritin drawn and I will start iron therapy. The     patient is still bleeding and I will give Lo-Ovral t.i.d. to stop the     bleeding. She will need followup with gynecology as an outpatient.  3. Untreated type 2 diabetes. I will check a hemoglobin A1C and CBG's. The     patient will be on a low salt, ADA diet.  4. Accelerated hypertension. See above.                                               Corinna L. Lendell Caprice, MD    CLS/MEDQ  D:  08/23/2003  T:  08/23/2003  Job:  [id number]

## 2011-01-09 NOTE — H&P (Signed)
[name], [name]               ACCOUNT NO.:  [id number]   MEDICAL RECORD NO.:  [id number]          PATIENT TYPE:  INP   LOCATION:  0377                         FACILITY:  [facility]   PHYSICIAN:  Mobolaji B. Bakare, M.D.DATE OF BIRTH:  1955/12/14   DATE OF ADMISSION:  06/23/2004  DATE OF DISCHARGE:                                HISTORY & PHYSICAL   CHIEF COMPLAINT:  Fever, chills for 2 days. Chest pain today.   HISTORY OF PRESENTING COMPLAINT:  [name] is a 55 year old African-  American female with history of type 2 diabetes, iron-deficiency anemia,  uterine fibroids. The patient presenting with onset of fever and chills  which started yesterday, associated diaphoresis. The patient had a repeat  episode of fever and chills yesterday and today accompanied by chest pain,  retrosternal, radiating from the back. No associated shortness of breath. No  diaphoresis at this time. No palpitations. No nausea. No vomiting. Chest  pain lasted less than 1 minute. The patient thought she was having a heart  attack. Hence, called 911 and was brought to the emergency department. In  the emergency department, she was found to be febrile with a temperature of  104. Chest pain occurred when she was laying down inactive. She denies any  heartburn. The patient denies any dysuria or urgency; however, she does feel  __________. There is no cough. No shortness of breath. No abdominal pain. No  headache. No vaginal discharge; however, she is still having spotting since  she started menstruating on the first of October.   REVIEW OF SYSTEMS:  No change in her vision. No skin rash. She does have  constipation but no diarrhea. No weight loss. No sputum production.   PAST MEDICAL HISTORY:  1.  Diabetes mellitus diagnosed in December 2004, and patient is on      medications but not compliant with medications. She has some insurance      issues. She is currently being followed up at [facility] by Dr.  Audria Nine; however, the patient has not seen a physician in the last 6      months.  2.  Hypertension.  3.  History of fibroid. She was supposed to followup with a gynecologist      after discharge from the hospital in January 2005, but she has not      followed up with any gynecologist. She was aware that she had uterine      fibroid while she was [place] five years ago, and ever since, she has      not done anything about it.  4.  Congestive heart failure.  5.  Iron-deficiency anemia.   CURRENT MEDICATIONS:  1.  Hydrochlorothiazide/triamterene 25 mg/37.5 one p.o. q.d.  2.  Glucophage 1,000 mg p.o. b.i.d.  3.  Prinivil 40 mg p.o. q.d.  4.  Metoprolol 100 mg p.o. b.i.d.  5.  Glipizide ER 5 mg p.o. q.d.   ALLERGIES:  No known drug allergies.   SOCIAL HISTORY:  She is single. She has no children. She lives with a  brother. She just relocated from [place] to  [place].   FAMILY HISTORY:  Both parents are deceased. One brother had stroke and also  had hypertension. She has significant family history of hypertension. Father  died of lung cancer. Mother had congestive cardiac failure.   OBSTETRICAL/GYNECOLOGICAL HISTORY:  LMP May 24, 2004. No dysmenorrhea.  She bled consistently for 10 days, and currently patient is spotting.   PHYSICAL EXAMINATION:  INITIAL VITAL SIGNS:  Blood pressure 194/83,  temperature 104.7, pulse 159, respiratory rate of 20, O2 saturation of 97%.  After initial treatment in the emergency department with Tylenol and  ceftriaxone, blood pressure is 156/76, temperature 98.9, pulse 95,  respiratory rate 18.  GENERAL:  On examination, she appears comfortable, not in acute respiratory  distress. Normocephalic, atraumatic.  HEENT:  Pupils are equal, round, and reactive to light. There is opacity of  the lenses bilaterally. Mucous membranes dry. No oral thrush. No exudate. No  cervical lymphadenopathy. No elevated JVD. No carotid bruit.  LUNGS:  Fascicular  breath sounds. Clear clinically.  CARDIOVASCULAR:  S1 and S2 regular. Ectopic beats.  ABDOMEN:  Distended. Hard mass which seems to be extending down to the  pelvic region, extending superiorly to the epigastrium and left upper  quadrant, nontender. Bowel sounds present.  EXTREMITIES:  No pedal edema. No calf tenderness. Dorsalis pedis pulses  palpable.  SKIN:  No rash. No petechiae.  CENTRAL NERVOUS SYSTEM:  No focal neurological deficits.   INITIAL LABORATORY DATA:  BMP:  Sodium 126, potassium 4.1, chloride 90, CO2  27, glucose 490, BUN 16, creatinine 1.3, calcium 8.3. Ketones:  Blood  ketones negative. CMP:  AST 28, ALT 15, alkaline phosphatase 208, bilirubin  1.0, creatinine 1.2. CBC and differential:  White cell count 22.6,  hemoglobin 6.9, hematocrit 22.1, MCV 75.8. UA:  White blood cells 0 to 2,  many bacteria, urine clear/turbid/amber color, specific gravity 1.028,  glucose greater than 1,000, moderate bilirubin, ketones 15, blood moderate,  protein 100, nitrate negative, leukocytes negative.   Chest x-ray:  No infiltrates, no cardiomegaly, no congestion.   EKG:  Normal sinus rhythm with heart rate of 98 beats per minute with heart  rate of 98 beats per minute, normal intervals, normal axis, flattened T-wave  in V6.   ASSESSMENT/PLAN:  [name] is a 55 year old African-American female with  history of diabetes mellitus, hypertension, uterine fibroid, and anemia  presenting with fevers, chills, chest pain, leukocytosis, anemia, and  abdominal mass with uncontrolled diabetes, hyperglycemia of 490, and  uncontrolled blood pressure.   1.  Fever in the setting of abnormal urinalysis. The white cells on      microscopy was normal. Given the cloudiness of the urine and many      bacteria, go ahead and treat for pyelonephritis, obtain blood cultures      and urine cultures. She received ceftriaxone in the emergency     department. Will continue with this, ceftriaxone 1 g IV  q.12h.  2.  Uncontrolled diabetes mellitus. Will start patient on Glucomander and      continue with oral hyperglycemic agent. Will give gentle rehydration in      view of the history of congestive heart failure.  3.  Uncontrolled hypertension. The patient is now compliant with her      medications. We will restart all of her medications:      Hydrochlorothiazide/triamterene, Prinivil, and metoprolol and if      necessary if blood pressure remains uncontrolled, we will optimize.  4.  Chest pain. This sounds atypical;  however, we will admit her telemetry      and rule out for myocardial infarction with serial cardiac enzymes. Will      check fasting lipid profile and trial of Protonix 40 mg p.o. q.d.  5.  History of fibroid. I reemphasized the need to follow up with OB/GYN.  6.  Iron-deficiency anemia, microcytic. Will check iron studies. The patient      is a TEFL teacher Witness, so she is not agreeable to blood transfusion.      Will also check stool O&P x2.  7.  Abdominal mass. This is most likely uterine fibroid. Will check an      abdominal and pelvic CT scan.  8.  History of congestive heart failure. Clinically, the patient is not in      congestive heart failure now. Ejection fraction in December 2004 was 55      to 65%, and this was thought to be due to diastolic dysfunction. Will      give gentle rehydration at this point.  9.  Hyponatremia, most likely secondary to hyperglycemia. Currently sodium      is 120.  10. Obesity. Will advise patient to loose weight and exercise more.  11. Increased alkaline phosphatase with a normal AST, ALT, and bilirubin.      Will evaluate the biliary tree. The patient is getting a CT scan of the      abdomen, and if necessary, may need ultrasound of gallbladder.  12. Deep vein thrombosis prophylaxis with subcutaneous Lovenox.      MBB/MEDQ  D:  06/24/2004  T:  06/24/2004  Job:  [id number]   cc:   M.D. Jonathon Jordan

## 2011-01-09 NOTE — Discharge Summary (Signed)
[name], [name]                           ACCOUNT NO.:  [id number]   MEDICAL RECORD NO.:  [id number]                   PATIENT TYPE:  INP   LOCATION:  0353                                 FACILITY:  [facility]   PHYSICIAN:  Corinna L. Lendell Caprice, MD             DATE OF BIRTH:  June 26, 1956   DATE OF ADMISSION:  08/23/2003  DATE OF DISCHARGE:  08/26/2003                                 DISCHARGE SUMMARY   DIAGNOSES:  1. New onset congestive heart failure most likely related to diastolic     dysfunction and high output failure from anemia.  2. New onset and accelerated hypertension.  3. Untreated diabetes type 2.  4. Acute on probably chronic blood loss anemia secondary to dysfunctional     uterine bleeding/menorrhagia.  5. History of fibroids.  6. Iron deficiency anemia, refused transfusion secondary to being a     [employer].   DISCHARGE MEDICATIONS:  1. Lisinopril 40 mg p.o. daily.  2. Lopressor 100 mg p.o. b.i.d.  3. Lo-Ovral one p.o. daily.  4. Hydrochlorothiazide 25 mg p.o. daily.  5. Glucophage 1000 mg p.o. b.i.d.  6. Iron supplementation.   ACTIVITY:  Ad lib.   DIET:  Diabetic, low salt.  She is encouraged to lose weight and exercise  more.   FOLLOWUP:  Follow up with [facility] in two to four weeks for diabetes and  blood pressure management.  [facility] four  weeks for further work-up of the menorrhagia.   CONDITION ON DISCHARGE:  Stable.   PERTINENT LABORATORIES:  Her CBC on admission was significant for hemoglobin  of 6.7, hematocrit 21.8 with MCV 72.8, platelet count normal, white count  normal.  Coagulation panel normal.  Her complete metabolic panel on  admission was remarkable for a glucose of 185, otherwise unremarkable.  Her  hemoglobin A1C was 7.9.  CPK, MB, and troponin were negative x3.  B-type  natriuretic peptide was 115.  TSH normal.  Ferritin 12.  Special studies and  radiology:  Chest x-ray showed CHF and  cardiomegaly.  EKG showed normal  sinus rhythm.  Echocardiogram showed good ejection fraction and LVH with  atrial dilatation.  __________ of the legs were normal.  No DVT.   HISTORY AND HOSPITAL COURSE:  [name] is a pleasant 55 year old black  female with a history of type 2 diabetes who has not taken her diabetic  medications for about a year.  She had been followed at [facility]  She  also has been having menorrhagia, her periods lasting sometimes a few weeks.  She was told many years ago that she had fibroids, but that no hysterectomy  or other treatment was needed as she was soon to be menopausal.  She  presented with shortness of breath, leg swelling.  She denied chest pain.  She was found to be in congestive heart failure and severely anemic.  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  She was  admitted to telemetry and given diuretics.  Her blood pressure initially was  greater than 200 systolic.  She had no previous history of hypertension.  Her echocardiogram showed good ejection fraction and therefore her  congestive heart failure is due to diastolic dysfunction/uncontrolled  hypertension and anemia/high output failure.  Her swelling and dyspnea  improved quickly with diuretics and blood pressure control.  She was found  to be iron deficient and was started on iron supplementation.  She was also  given Lo-Ovral t.i.d. to stop her bleeding as she was currently still  menstruating.  Her urine pregnancy was negative.   She was started back on Glucophage which she had been on previously for  diabetes and started on a diabetic low salt diet.  She should follow up with  [facility] for blood pressure check and management of diabetes.  She also  will need a fasting lipid profile down the line.  She also is to follow up  with [facility] for further management of her menorrhagia and history of  fibroid tumors.  No imaging studies were done of her uterus  and this may  need to be done as an outpatient.                                               Corinna L. Lendell Caprice, MD    CLS/MEDQ  D:  08/26/2003  T:  08/27/2003  Job:  [id number]   cc:   [facility]   [facility]

## 2011-01-09 NOTE — Discharge Summary (Signed)
NAMEANGELIS, [name]               ACCOUNT NO.:  [id number]   MEDICAL RECORD NO.:  [id number]          PATIENT TYPE:  INP   LOCATION:  0377                         FACILITY:  [facility]   PHYSICIAN:  Mallory Shirk, MD     DATE OF BIRTH:  1956-02-04   DATE OF ADMISSION:  06/23/2004  DATE OF DISCHARGE:                                 DISCHARGE SUMMARY   DISCHARGE DIAGNOSES:  1.  Anemia.  2.  Uterine fibroids.  3.  Diabetes mellitus.  4.  Hypertension.  5.  Fever/leukocytosis.   DISCHARGE MEDICATIONS:  1.  Ampicillin 250 mg p.o. q.i.d.  2.  Aspirin 325 mg p.o. q.d.  3.  Glipizide 10 mg p.o. q.d.  4.  Lisinopril 40 mg p.o. q.d.  5.  Metoprolol 100 mg p.o. b.i.d.  6.  Maxzide 37.5/25 1 tablet p.o. q.d.  7.  Aranesp 60 mg mcg subcu weekly.  8.  Lotrimin 1% topical to be applied by affected area b.i.d.   FOLLOW-UP APPOINTMENTS:  1.  Dr. Floyde Parkins on July 02, 2004.  2.  Dr. Dow Adolph, primary care physician, on July 03, 2004 at      8:30 a.m.   Patient will review all medications to Dr. Audria Nine at that visit.   HISTORY OF PRESENT ILLNESS:  Ms. Knapper is a 55 year old African-American  woman who presented to the emergency department at [facility] on June 23, 2004 with a chief complaint of fever, chills, and chest pain.  Patient  has a history of type 2 diabetes, iron-deficiency anemia, and uterine  fibroids.  Patient states that her symptoms started the day before  presentation, associated with diaphoresis.  She also had accompanying chest  pain, which was in the retrosternal area, radiating from the back.  No  associated shortness of breath, nausea or vomiting.  Patient thought she was  going to have a heart attack and called 911.  In the ER, she was found to be  have a temperature of 104.   PAST MEDICAL HISTORY:  1.  Diabetes mellitus.  2.  Hypertension.  3.  Uterine fibroids.  4.  CHF.  5.  Iron-deficiency anemia.   MEDICATIONS ON ADMISSION:  1.   HCTZ/triamterene 25/37.5 1 tablet p.o. q.d.  2.  Glucophage 1000 mg p.o. b.i.d.  3.  Lisinopril 40 mg p.o. q.d.  4.  Metoprolol 100 mg p.o. b.i.d.  5.  Glipizide 5 mg p.o. q.d.   ALLERGIES:  No known drug allergies.   ADMISSION PHYSICAL EXAMINATION:  VITAL SIGNS:  Blood pressure 194/83, pulse  159, respiratory rate 20, sats 97% on room air, temperature 104.5.  After  initial evaluation and treatment in the ER with Tylenol and ceftriaxone,  blood pressure 156/76 and temperature 98.9, pulse 95, respiratory rate 18.  GENERAL:  Patient appeared comfortable in no acute distress.  HEENT:  Normocephalic and atraumatic.  PERRLA.  Mucous membranes moist.  Oropharynx nonerythematous.  NECK:  No lymphadenopathy.  No JVD.  No carotid bruits.  LUNGS:  Clear to auscultation bilaterally.  CARDIOVASCULAR:  S1 and S2.  Regular rate  and rhythm.  ABDOMEN:  Distended.  A hard mass from the epigastrium extending down into  the pelvic region.  Left upper quadrant nontender.  Positive bowel sounds.  EXTREMITIES:  No clubbing, cyanosis or edema.  CENTRAL NERVOUS SYSTEM:  No focal neurologic deficits seen.   LABS ON ADMISSION:  Sodium 126, potassium 4.1, chloride 90, carbon dioxide  27, glucose 490, BUN 16, creatinine 1.3, calcium 8.3.  Ketones negative.  AST 28, ALT 15, alkaline phosphatase 208.  Total bilirubin 1.0.  Creatinine  1.2.  WBCs 22.6, hemoglobin 6.9, hematocrit 22.1, MCV 75.8.  UA:  Glucose  greater than 1000, ketones 15, moderate blood, protein 100, leukocyte  esterase negative.   CHEST X-RAY:  No infiltrate, cardiomegaly, or congestion.   EKG:  Normal sinus rhythm.  Heart rate 98 beats per minute.   Patient was admitted to a monitored bed.   HOSPITAL COURSE:  1.  Fever/leukocytosis:  Patient was started on ceftriaxone.  She did not      have any episodes of hypotension during the stay.  Her temperature was      below 100 degrees after hospital day #1.  On the day of discharge, the       patient's temperature was 98.6.  Her leukocytosis with WBCs on admission      of 23.6, was resolving.  WBCs on the day of discharge were 14.3.      Patient will be discharged on a 7-day course of ampicillin p.o.   1.  Diabetes mellitus:  Patient was placed on Glucomander scale, which was      subsequently discontinued, and the patient was started on a sliding      scale.  Her home oral anti-hypoglycemic agents, metformin and glipizide      were resumed.  Her blood sugars were well controlled during the hospital      stay.   1.  Uterine fibroids:  CT scan of the abdomen showed a large abdominal      pelvic mass with dystrophic calcification, lobulation in the area of      presumed central necrosis, appears to be originating from the uterus.      Differential is leiomyomata versus leiomyosarcoma.  Patient will be seen      by Dr. Floyde Parkins on July 02, 2004 for evaluation and further      management of this uterine fibroid.   1.  Anemia:  Patient's admission H&H was 6.9/22.1.  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  She was started on Aranesp 57      mcg subcu weekly.  This will be administered by PCP as an outpatient.      Patient was also visited by a member of Dr. Particia Jasper staff, and she      has agreed to enroll in a research study with IV iron for anemia.  This      will be followed by Dr. Rosalio Macadamia.   1.  Hypertension:  Patient's home medications, lisinopril, Maxzide, and      metoprolol will resume.  Patient has been very noncompliant with her      medications.  On the day of discharge, her blood pressure was 129/70.      She has been discharged without any changes in her antihypertensive      agents.  Dr. Audria Nine, primary care physician, will check her on      July 03, 2004 and adjust antihypertensives as needed.  1.  Chest pain:  Patient's troponins were negative.  Patient's chest pain     had resolved after admission to the  hospital.  This was determined to be      noncardiac in origin.  Patient has been advised to return to the PCP or      the ED immediately upon onset of chest pain or shortness of breath.   Patient was advised to take her medications as prescribed and keep up her  follow-up appointments.  She was advised to return to the emergency  department immediately upon onset of chest pain or shortness of breath, but  any other symptoms that may need immediate medical attention.      GDK/MEDQ  D:  06/26/2004  T:  06/26/2004  Job:  [id number]   cc:   Maurice March, M.D.  [address] [place]  [place] [address]  Fax: [phone]   Eliberto Ivory. Rosalio Macadamia, M.D.  [address]  [place]  [place] [address]  Fax: [phone]

## 2011-01-09 NOTE — Consult Note (Signed)
[name], MOSCO NO.:  [id number]   MEDICAL RECORD NO.:  [id number]          PATIENT TYPE:  INP   LOCATION:  1833                         FACILITY:  [facility]   PHYSICIAN:  Richardean Sale, M.D.   DATE OF BIRTH:  1956-03-24   DATE OF CONSULTATION:  DATE OF DISCHARGE:                                   CONSULTATION   CHIEF COMPLAINT:  1.  Abdominal pain.  2.  Vaginal bleeding.   HISTORY OF PRESENT ILLNESS:  This is a 55 year old gravida 0, African  American female who presented to the [facility] ER on July 29, 2004,  complaining of abdominal pain.  The patient was found to be significantly  anemic on initial laboratory studies with the blood count of approximately  7.  On further questioning, it was found that the patient has been having  vaginal bleeding almost daily for the past few months.  The patient  describes her abdominal pain as beginning in the right lower quadrant, sharp  in nature, at worst a 10/10 and at best, a 5/10.  She denies any nausea,  vomiting or diarrhea and has been having trouble with constipation secondary  to iron therapy.   PAST MEDICAL HISTORY:  Significant for iron-deficiency anemia.  She was  evaluated at the [facility] ER approximately one month ago and was found to  be significant anemic with hemoglobin of 6 and was started on Epogen  injections.  Due to lack of insurance and ability to obtain medical  treatments, the patient has been unable to continue with her Epogen  medication.  She was found at that time to have a very large pelvic mass  suggestive of uterine fibroids or possibility of leiomyoma sarcoma, but the  patient was unable to keep her followup with gynecology.  The patient states  for the past two years, her menstrual cycles have been increasingly more  heavy and becoming more frequent.  She now describes them as having daily  discharge.  She changes pads frequently, but denies any flooding of her  clothes.  She  also denies any menstrual-like cramping.  She does have the  remote history of an abnormal Pap smear, but her last Pap smear was greater  than 10 years ago.  She has not been to a gynecologist in over the last 10  years.  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   PAST MEDICAL HISTORY:  1.  Hypertension.  2.  Noninsulin-dependent diabetes.   OBSTETRIC HISTORY:  Gravida 0.   SURGICAL HISTORY:  None.   GYNECOLOGICAL HISTORY:  A remote history of abnormal Pap smear.  No history  of STD's.  Significant for irregular menses with daily vaginal bleeding over  the last few months.   SOCIAL HISTORY:  Denies any tobacco, alcohol or drugs.  She is single.   FAMILY HISTORY:  Mother died from diabetes.  Father died secondary to bone  cancer.  Siblings with diabetes and hypertension.   REVIEW OF SYSTEMS:  Positive for shortness of breath and dyspnea  on  exertion.  Negative for chest pain, palpitations, negative for syncopal  episodes.  Positive for vaginal bleeding, abdominal pain, constipation.  Denies dysuria, hematuria, diarrhea.  No change in appetite.  Questionable  unintentional weight loss.   PHYSICAL EXAMINATION:  VITAL SIGNS:  Blood pressure 148/47, pulse 105 to  120, temperature 100.9, respirations 24.  O2 saturations 100% on room air.  GENERAL:  She is an elderly-appearing African American female who appears  older than her stated age, and in no acute distress.  ABDOMEN:  Obese.  There is a firm, palpable mass throughout the abdomen that  extends into the left upper quadrant.  It is mildly tender to palpation, no  rebound or guarding.  GU:  External genitalia appeared grossly normal.  There is moderate amount  of blood present in the vagina.  The vagina is pink, moist and rugated  without lesions, Bartholin, urethral and Skene's glands are within normal  limits.  Bladder and urethra are normal.  The cervix is  without lesions.  Pap smear obtained.  Bimanual exam:  There is a large mass that has lifted  the uterus out of the pelvis.  The adnexa are unable to be palpated.  The  mass is not mobile.  Rectovaginal exam confirms the above exam.  Normal  rectal tone.  No rectal masses.   LABORATORY DATA:  Laboratory studies, hemoglobin 7.6, hematocrit 23.9, white  blood cell count 21.5, platelet count 886.  Sodium 128, potassium 4.7,  chloride 95, CO2 19, glucose 248, BUN 18, creatinine 1.4, total bilirubin  0.8.  Alkaline phosphatase 170.  AST 23, ALT 17.  Total protein 8.2.  Albumin 2.3.  Calcium 8.4.  Serum lipase 17.  Urinalysis negative.  PT 15.1,  INR 1.3, PTT 31.  Urine pregnancy test is negative.   RADIOLOGIC STUDIES:  A pelvic ultrasound reveals an enlarged abdominal  pelvic mass with calcification and presumed necrosis, and associated free  fluid in the pelvis.  Estimated measurement of the mass is 21 x 20 x 14 cm.  Clinical consideration includes degenerating fibroid versus leiomyosarcoma.  Adnexa are unable to be visualized.  CT scan from November 1.  There is a  large mass extending from the pelvis into the left upper quadrant which has  several lobulations, heterogeneity and areas of central necrotic  degeneration as well as dystrophic calcification.  Measurements at that time  were 26 x18 cm.  It appears to be arising from the uterus again suggestive  of multiple uterine fibroids versus leiomyomasarcoma.  No evidence of  obstructive uropathy or hydronephrosis.  Adnexa are unable to be visualized.   ASSESSMENT/PLAN:  A 55 year old gravida 0, black female with significant  anemia likely secondary to chronic blood loss, large pelvic/abdominal mass,  possibly leiomyoma versus leiomyosarcoma and hypertension, poorly controlled  diabetes, mild fever, and elevated white count.  1.  Upon further discussion with the patient, the patient states she has      been able to palpate a mass in the  left upper quadrant for approximately      10 years.  Given this finding, clinical picture is consistent with      uterine fibroids and not so much a leiomyosarcoma although      leiomyosarcoma still remains a possibility given the patient's      significant anemia, transfusion was recommended, but the patient refuses      secondary to religious beliefs as she is a Scientist, product/process development.  To  control her bleeding at this point, I recommend treatment with Lupron,      both for cessation of menses and in an attempt to decrease the size of      the uterus.  Given the large pelvic mass, surgery is indicated, but at      this point, with the patient's significant anemia and other health      condition, surgery would likely result in extensive blood loss that      would need to be treated with transfusion.  2.  Recommend repeat CT scan of the abdomen and pelvis to evaluate for any      growth of the mass since November 1.  3.  Pap smear obtained.  We will obtain endometrial biopsy when equipment is      available.  4.  Consider IV iron therapy.  We will await recommendations from internal      medicine.  5.  Fever.  Leukocytosis.  This may be secondary to a degenerating uterine      fibroid.  Recommend broad spectrum antibiotics and await blood culture      results.  6.  Abdominal pain.  Again, possibly secondary to the large degenerating      uterine fibroid.  Treat with pain medication as needed.  7.  Given the very large size of this mass and the possibility of a      leiomyomasarcoma, recommend consultation with GYN oncology for      recommendations regarding planning of surgery.  8.  We will continue to follow while the patient is hospitalized.       JW/MEDQ  D:  07/29/2004  T:  07/30/2004  Job:  [id number]

## 2011-01-09 NOTE — Discharge Summary (Signed)
NAMEWHITTANY, PARISH NO.:  [id number]   MEDICAL RECORD NO.:  [id number]          PATIENT TYPE:  INP   LOCATION:  1833                         FACILITY:  [facility]   PHYSICIAN:  Ileana Roup, M.D.  DATE OF BIRTH:  01/28/1956   DATE OF ADMISSION:  07/29/2004  DATE OF DISCHARGE:                                 DISCHARGE SUMMARY   DISCHARGE DIAGNOSES:  1.  Abdominal pain, unclear etiology, most probably secondary to giant mass.  2.  Uterine fibroids.  3.  Diabetes mellitus.  4.  Hypertension.  5.  Congestive heart failure.   DISCHARGE MEDICATIONS:  1.  Rocephin 1 g IV q.24h.  2.  NovoLog insulin sliding scale.  3.  Protonix 40 mg p.o. daily.  4.  Zosyn 3.375 g IV.  5.  Phenergan.  6.  Metoprolol 100 mg p.o. b.i.d.  7.  Iron 100 mg IV q.24h.  8.  Aminocaproic acid 5000 mg IV.   CONDITION AT DISCHARGE:  Fair, same as admission.   PROCEDURES:  1.  Ultrasound of the pelvis that showed multiple longitudinal and      transverse images.  There is a large abdominal and pelvic mass with      dystrophic calcification.  It is difficult to determine exactly which      structure it arises from.  There are areas of presumed central necroses.      Considerations would include degenerating fibroid versus leiomyosarcoma.      There is a moderate amount of free fluid which was not present on      previous image CT.  This study represents ascites, bleeding, or serous      fluid arising from the mass.  Cross-sectional measurements are      approximately 21 x 20 x 14 cm.  Impression:  Large abdominal and pelvic      mass with calcification and presumed necrosis associated with free      peritoneal fluid.  2.  CT of the abdomen without contrast.  Impression:  Stable, very large      heterogenous partially-calcified abdominopelvic mass with new ascites      and omental stranding worrisome for metastatic disease.  Mildly dilated      loops of small bowel with suggestion of a  subtle transition point in the      mid abdomen.  Developing or partial small-bowel obstruction.  Impression      again of CT of the pelvis was large pelvic mass with ascites; metastatic      disease not excluded.   CONSULTATION:  Dr. Annabell Howells, gynecology.   BRIEF ADMITTING HISTORY AND PHYSICAL:  This is a 55 year old African-  American female that presented to ER on the date of July 29, 2004 for  abdominal pain.  She has a past medical history significant for uterine  fibroids complicated with menometrorrhagia for which she has previously  admissions, and anemia.  The patient is a TEFL teacher Witness and her religion  prohibits her of having blood transfusion.  The patient stated that she has  been having abdominal pain for  the past month.  She has been fatigued and  dizzy for the same period of time.  On the day of admission, she started  having intense abdominal pain, right upper quadrant, right flank, and right  lower quadrant.  It was very intense, 10/10, no radiation, associated with  diaphoresis.  No nausea, no vomiting, no diarrhea, but constipation.  Also,  the patient states that she lost weight over the past several months - 70  pounds unintentional.   PAST MEDICAL HISTORY:  1.  Anemia.  2.  Uterine fibroids.  3.  Diabetes mellitus.  4.  Hypertension.  5.  Fever and leukocytosis at last admission on June 23, 2004 for which      she was admitted to [facility].  6.  History of CHF; ejection fraction 55-65% in January 2005.   PHYSICAL EXAMINATION:  VITAL SIGNS:  Heart rate 104, blood pressure 148/47,  temperature 100.9, respiratory rate 24, O2 saturation 100% on room air.  GENERAL:  This is a 55 year old African-American female in acute distress  complaining of abdominal pain.  HEENT:  Eyes:  Pinpoint pupils.  ENT:  Poor dentition.  Nasal and oropharynx  mucosa normal.  NECK:  Slight obesity, no thyromegaly, no lymphadenopathy.  RESPIRATORY:  Clear to auscultation.   CARDIOVASCULAR:  Regular rate and rhythm; no murmurs, no rubs, no gallops.  GASTROINTESTINAL:  Abdomen obese, distended, tender to palpation most  predominant on the right upper quadrant, right flank, right lower quadrant.  Very decreased bowel sounds.  EXTREMITIES:  No edema.  GENITOURINARY:  Normal mucosa.  Bright blood per cervical os.  SKIN:  Dry.  LYMPHATIC:  No lymphadenopathy.  NEUROLOGIC:  No focal neurologic deficit.  PSYCHOLOGIC:  Oriented x3.   LABORATORY DATA AT ADMISSION:  Sodium 128, potassium 4.7, chloride 95,  bicarb 19, BUN 18, creatinine 1.4, glucose 248, anion gap of 14, bilirubin  0.8, alk phos 170, AST 23, ALT 17, protein 8.2, albumin 2.3, calcium 8.4.  HCG negative.  PTT 31, PT 15.1, INR 1.3.  Hemoglobin 7.6, hematocrit 23.9,  white blood cells 21.5, platelets 856.  Wet prep was negative for yeast,  Trichomonas, negative for clue cells.  Urinalysis was within normal limits.   HOSPITAL COURSE:  #1 - ABDOMINAL PAIN IN A 55-YEAR-OLD AFRICAN-AMERICAN  FEMALE WITH PAST MEDICAL HISTORY OF FIBROIDS.  She presented on pelvic  ultrasound and on abdominal CT uterine mass of 21 x 20 x 14 cm and large  heterogenous, partially calcified, with new ascites and omental stranding  worrisome for metastatic disease.  Also, on CT she presented dilated loops  of small bowel with suggestion of subtle transition point in mid abdomen,  probably developing small-bowel obstruction.  The patient was also  presenting leukocytosis with leukocytes in the range of 21.5 and vaginal  bleeding complicated with anemia with a hemoglobin of 7.6.  The patient was  started on ceftriaxone and Zosyn.  She received fluids.  Her hemoglobin was  stable and repeated hemoglobin remained in the range of 7.6.  We interpreted  the abdominal pain with vaginal bleeding as probably secondary to large  fibroid complicated with necrosis versus abscess.  Also, another pathologic process intraabdominal cannot be  excluded.  The large mass in pelvic plus  abdomen caused ureteral obstruction with hydronephrosis bilaterally which  increased the patient's creatinine.  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  The patient  is Jehovah's Witness so she cannot  receive fresh frozen plasma or other blood products.  She was started on  aminocaproic acid to stop the bleeding.  We also consulted the GYN, Dr.  Annabell Howells.  She agreed with the plan and she decided to transfer the patient to  [facility].   #2 - HYPERTENSION.  Blood pressure has been stable.  The patient received  metoprolol 100 mg b.i.d.  On the day of discharge or transfer her blood  pressure decreased so metoprolol was stopped.   #3 - DIABETES MELLITUS TYPE 2.  The patient was started on NovoLog SSI  moderate sensitive.   #4 - PROPHYLAXIS.  The patient was placed on PAS hose.   LABORATORY DATA AT DISCHARGE:  Sodium 125, potassium 4.9, chloride 95,  bicarb 20, glucose 132, BUN 31, creatinine 2.5, bilirubin 0.6, alkaline  phosphatase 98, AST 16, ALT 14, total protein 7, albumin 1.9, calcium 7.7,  lactic acid 2.  CBC:  White blood cells 20.6, red blood cells 3.14,  hemoglobin 7.6, hematocrit 23.4, MCV 74.6.       DA/MEDQ  D:  07/30/2004  T:  07/30/2004  Job:  [id number]   cc:   Richardean Sale, M.D.  [address]  [place]  [place] [address]  Fax: [phone]

## 2011-03-27 ENCOUNTER — Emergency Department (HOSPITAL_COMMUNITY): Payer: Medicaid Other

## 2011-03-27 ENCOUNTER — Observation Stay (HOSPITAL_COMMUNITY)
Admission: EM | Admit: 2011-03-27 | Discharge: 2011-03-29 | Disposition: A | Discharge status: Home / Self Care | Payer: Medicaid Other | Attending: Internal Medicine | Admitting: Internal Medicine

## 2011-03-27 ENCOUNTER — Observation Stay (HOSPITAL_COMMUNITY): Payer: Medicaid Other

## 2011-03-27 DIAGNOSIS — R9431 Abnormal electrocardiogram [ECG] [EKG]: Secondary | ICD-10-CM | POA: Insufficient documentation

## 2011-03-27 DIAGNOSIS — J45901 Unspecified asthma with (acute) exacerbation: Principal | ICD-10-CM | POA: Insufficient documentation

## 2011-03-27 DIAGNOSIS — E785 Hyperlipidemia, unspecified: Secondary | ICD-10-CM | POA: Insufficient documentation

## 2011-03-27 DIAGNOSIS — Z794 Long term (current) use of insulin: Secondary | ICD-10-CM | POA: Insufficient documentation

## 2011-03-27 DIAGNOSIS — G4730 Sleep apnea, unspecified: Secondary | ICD-10-CM | POA: Insufficient documentation

## 2011-03-27 DIAGNOSIS — M7989 Other specified soft tissue disorders: Secondary | ICD-10-CM | POA: Insufficient documentation

## 2011-03-27 DIAGNOSIS — E119 Type 2 diabetes mellitus without complications: Secondary | ICD-10-CM | POA: Insufficient documentation

## 2011-03-27 DIAGNOSIS — Z79899 Other long term (current) drug therapy: Secondary | ICD-10-CM | POA: Insufficient documentation

## 2011-03-27 DIAGNOSIS — I10 Essential (primary) hypertension: Secondary | ICD-10-CM | POA: Insufficient documentation

## 2011-03-27 LAB — CBC
HCT: 37.2 % (ref 36.0–46.0)
MCHC: 30.6 g/dL (ref 30.0–36.0)
Platelets: 323 10*3/uL (ref 150–400)
RDW: 14.6 % (ref 11.5–15.5)
WBC: 9.4 10*3/uL (ref 4.0–10.5)

## 2011-03-27 LAB — DIFFERENTIAL
Basophils Absolute: 0 10*3/uL (ref 0.0–0.1)
Basophils Relative: 0 % (ref 0–1)
Eosinophils Absolute: 0.4 10*3/uL (ref 0.0–0.7)
Eosinophils Relative: 4 % (ref 0–5)
Lymphocytes Relative: 30 % (ref 12–46)
Monocytes Absolute: 0.6 10*3/uL (ref 0.1–1.0)

## 2011-03-27 LAB — COMPREHENSIVE METABOLIC PANEL
Albumin: 3.7 g/dL (ref 3.5–5.2)
Alkaline Phosphatase: 165 U/L — ABNORMAL HIGH (ref 39–117)
BUN: 10 mg/dL (ref 6–23)
Calcium: 9.9 mg/dL (ref 8.4–10.5)
Creatinine, Ser: 0.91 mg/dL (ref 0.50–1.10)
GFR calc Af Amer: >60 mL/min (ref >60)
Glucose, Bld: 91 mg/dL (ref 70–99)
Total Protein: 8.5 g/dL — ABNORMAL HIGH (ref 6.0–8.3)

## 2011-03-27 LAB — CK TOTAL AND CKMB (NOT AT ARMC)
CK, MB: 4 ng/mL (ref 0.3–4.0)
Relative Index: 0.8 (ref 0.0–2.5)
Total CK: 494 U/L — ABNORMAL HIGH (ref 7–177)

## 2011-03-27 LAB — GLUCOSE, CAPILLARY: Glucose-Capillary: 309 mg/dL — ABNORMAL HIGH (ref 70–99)

## 2011-03-27 LAB — PRO B NATRIURETIC PEPTIDE: Pro B Natriuretic peptide (BNP): 5 pg/mL (ref 0–125)

## 2011-03-27 NOTE — H&P (Signed)
NAMERENIYAH, GOOTEE NO.:  [id number]  MEDICAL RECORD NO.:  [id number]  LOCATION:  1434                         FACILITY:  [facility]  PHYSICIAN:  Pleas Koch, MD        DATE OF BIRTH:  Aug 31, 1955  DATE OF ADMISSION:  03/27/2011 DATE OF DISCHARGE:                             HISTORY & PHYSICAL   PRIMARY CARE PHYSICIAN:  Jackie Plum, M.D.  CHIEF COMPLAINT:  Shortness of breath.  HISTORY OF PRESENT ILLNESS:  This is a very pleasant African American female with medical history significant for, 1. Reactive airway disease, not specified. 2. Obstructive sleep apnea, followed at [facility]. 3. Diabetes mellitus. 4. Morbid obesity. 5. History of total abdominal hysterectomy. 6. Hyperlipidemia,       She presents with a 73-month history of shortness of     breath.  She also ascribes some history of lower extremity swelling,     but states that this started after she was placed on Azor by a     primary care physician.     She states that the swelling in her feet     have been waxing and waning as are the shortness of breath     symptoms.  The patient has never been intubated for her reactive     airway disease, however, knows to come to the emergency room and     albuterol does not work for her as it has been today.       She is Sure she doens't have COPD-and has been using prn albterol for      this in the past     She has been feeling actually more worse over the past week.  She has been     having some sputum, which has been clear to yellow, but no real     fever or chills.  At baseline, she uses 2 pillows to sleep and     states that especially when she walks, she gets short of breath.     She denies any specific chest pain or any GERD-like symptoms.     There is no radiation of any pain down her arm or into her neck.  In the emergency room, she received a dose of IV Lasix and was nebulized 3 times with albuterol, but still her saturations were hanging  out in the 80s to 90s.  PAST MEDICAL HISTORY:  Significant for the above. 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  She has had cataract surgeries as well.  ALLERGIES:  She is allergic to AZOR apparently now because she has lower extremity swelling; the dosage of that medication contains a component of amlodipine 5 mg.  The patient states she was taken off THIAZIDE because she was developing some renal insufficiency and has only recently stopped her care with Dr. Julio Sicks, but saw a nurse practitioner at that point in time.  FAMILY HISTORY:  Significant for mother who has diabetes mellitus, dada has a history of cancer and pneumonia.  On questioning the patient further, the patient has obtained most of her primary care at [facility] and only recently transferred once again to Dr. Julio Sicks.  She has had what seems to be an EKG and echocardiogram, but has never had a stress test, but has been told in the past that she has enlarged heart.  MEDICATIONS:  She is on, 1. Albuterol as needed. 2. Azor 5/20 once daily. 3. Metformin 1000 b.i.d. 4. Metoprolol tartrate 100 once daily. 5. Quinapril 40 mg daily. 6. Simvastatin 40 daily. 7. She takes Lantus 75 units twice daily.  PHYSICAL EXAMINATION:  GENERAL:  The patient is alert, oriented, pleasant African American female, not in any distress at bedside, able to talk in complete sentences.  On asking her how she feels now, she says she feels 8/10 and 10 being her normal self.  As such, she was given a dose of methylprednisolone 125 mg stat at bedside and I did ask emergency room RN to arrange a 30-minute nebulization, however, on ambulation, the patient  desaturated to the 80% to 82% range. VITAL SIGNS:  Temperature 98.1, blood pressure 167 to 172 over 73 to 91, pulse 80, respirations 18, O2 sats 93%, GCS 15.  HEENT:  The patient is morbidly obese.  She has a Mallampati score of 3.  I am not able to appreciate her fundus at bedside as the patient is in the hallway and I do not have the equipment. NECK:  She has no JVD.  No pallor.  No icterus.  No carotid bruit. CHEST:  Clear but, there are decreased breath sounds bilaterally.  I do not appreciate any long tactile vocal resonance or fremitus. HEART:  S1, S2.  No murmurs, rubs or gallops.  I am again not able to appreciate her point of maximal impulse given that she is in the hallway. ABDOMEN:  Soft, nontender.  She has grade 1 to 2 pitting edema in the lower extremities. NEUROLOGIC:  She is grossly intact.  LABORATORY DATA:  CBC; WBC 9.4, hemoglobin 11.4, hematocrit 37.2, platelet count 323.  Troponin I less than 0.3.  CMP; sodium 142, potassium 3.2, chloride 103, CO2 30, glucose 91, BUN and creatinine 10/0.9, bilirubin 0.5, alkaline phosphatase 165, AST and ALT 17/11, total protein 8.5, CK-MB 494.  ProBNP was 5.  Chest x-ray done, 2-view, showed increasing pulmonary edema.  On review of the images indeed there seems to be increasing vascularity of the right side of the chest compared to prior x-ray done, October 07, 2010,   IMPRESSION/ASSESSMENT:  This is a pleasant 55 year old African American female with history of possible congestive heart failure and the known history of reactive airway disease/chronic obstructive pulmonary disease/asthma, coming in with increasing shortness of breath with coincidental use of Azor, which may attribute to her lower extremity swelling.  PROBLEM LIST: 1. Shortness of breath.  This sounds more pulmonary>CHF.  The     patient received a 30-minute neb and responded well.  She said she     was feeling 10/10 and ready to go home, but her O2 sats  dropped to     81% on ambulation off oxygen.  As such, I feel it is necessary to     get her optimize with IV steroids.  We will place her on     methylprednisolone 125 mg q.12 for 24 hours, then     methylprednisolone 60 mg daily.  She does carry a history of     obstructive sleep apnea and we will get CPAP per RT discretion.     Her Morphology is suspicious for both a Restrictive componenent, as well as her OHS     diagnosis and she likely will need pulmonology to see her in the outpatient     Would recommend having her follow with Darrol Poke for [facility] as well as for [employer] testing  2. Lower extremity swelling and pulmonary edema on x-ray.  We will get     an echocardiogram stat in a.m. March 27, 2011, to determine her     overall LV function as well as the severity of possible pulmonary     hypertension secondary to her obstructive hypoventilation syndrome.     She possible has Pulmonary Arterial htn.  3. Diabetes mellitus.  I anticipate that her diabetes control will be     poor given the fact that I am placing on high-dose steroids.  We     will keep her on a resistant sliding scale coverage.  I will obtain     HbA1c to determine this and keep her on basal Lantus at 50 units     subcutaneous b.i.d.  We will also continue t.i.d. coverage with     meals and with h.s. coverage and keep her on a carbon modified 60-g     diet.  We will cover with 4 units of NovoLog supplemental as well.     We will hold on her metformin in the hospital for the time being.  4. Hypertension.  Her blood pressure was elevated on admission.  We     will re-implement her blood pressure medications when they are all     reconciled other than her Azor and she may need up-titration of her     metoprolol if pulse rate allows the same.  I will discontinue her     Azor and likely place her on a thiazide diuretic such as HCTZ 12.5     mg p.o. q.a.m.  We may also add an ARB to her medications, although     she  is currently on quinapril 40 as well and there is no evidence     that ARB plus ACE has any benefit except in renal patient.  5. Please note that the patient is a Jehovah Witness.  6. Her last echocardiogram done per our records was August 23, 2003,     which showed EF of 55% to 60%.  The patient will be admitted to [facility] and will be reviewed on day-to-day basis.  If she is feeling completely better by morning, she could be able to go home to get an outpatient workup for these issues.  However, if her shortness of breath persists, she may need further steroids and albuterol acutely.          ______________________________ Pleas Koch, MD     JS/MEDQ  D:  03/27/2011  T:  03/27/2011  Job:  [id number]  Electronically Signed by Pleas Koch MD on 03/27/2011 10:23:54 PM

## 2011-03-28 LAB — DIFFERENTIAL
Basophils Absolute: 0 K/uL (ref 0.0–0.1)
Basophils Relative: 0 % (ref 0–1)
Eosinophils Absolute: 0 K/uL (ref 0.0–0.7)
Eosinophils Relative: 0 % (ref 0–5)
Lymphocytes Relative: 13 % (ref 12–46)
Lymphs Abs: 1.6 K/uL (ref 0.7–4.0)
Monocytes Absolute: 0.1 K/uL (ref 0.1–1.0)
Monocytes Relative: 1 % — ABNORMAL LOW (ref 3–12)
Neutro Abs: 11 K/uL — ABNORMAL HIGH (ref 1.7–7.7)
Neutrophils Relative %: 86 % — ABNORMAL HIGH (ref 43–77)

## 2011-03-28 LAB — GLUCOSE, CAPILLARY
Glucose-Capillary: 319 mg/dL — ABNORMAL HIGH (ref 70–99)
Glucose-Capillary: 412 mg/dL — ABNORMAL HIGH (ref 70–99)
Glucose-Capillary: 382 mg/dL — ABNORMAL HIGH (ref 70–99)

## 2011-03-28 LAB — COMPREHENSIVE METABOLIC PANEL
ALT: 14 U/L (ref 0–35)
AST: 14 U/L (ref 0–37)
Albumin: 3.8 g/dL (ref 3.5–5.2)
CO2: 30 mEq/L (ref 19–32)
Calcium: 9.4 mg/dL (ref 8.4–10.5)
Creatinine, Ser: 1.03 mg/dL (ref 0.50–1.10)
Sodium: 137 mEq/L (ref 135–145)

## 2011-03-28 LAB — CBC
HCT: 37.1 % (ref 36.0–46.0)
Hemoglobin: 11.3 g/dL — ABNORMAL LOW (ref 12.0–15.0)
MCH: 26.7 pg (ref 26.0–34.0)
MCHC: 30.5 g/dL (ref 30.0–36.0)
MCV: 87.7 fL (ref 78.0–100.0)
Platelets: 342 K/uL (ref 150–400)
RBC: 4.23 MIL/uL (ref 3.87–5.11)
RDW: 14.7 % (ref 11.5–15.5)
WBC: 12.8 K/uL — ABNORMAL HIGH (ref 4.0–10.5)

## 2011-03-28 LAB — HEMOGLOBIN A1C
Hgb A1c MFr Bld: 8.2 % — ABNORMAL HIGH (ref <5.7)
Mean Plasma Glucose: 189 mg/dL — ABNORMAL HIGH (ref <117)

## 2011-03-29 LAB — GLUCOSE, CAPILLARY: Glucose-Capillary: 319 mg/dL — ABNORMAL HIGH (ref 70–99)

## 2011-03-30 NOTE — Discharge Summary (Signed)
[name], [name] NO.:  [id number]  MEDICAL RECORD NO.:  [id number]  LOCATION:  1434                         FACILITY:  [facility]  PHYSICIAN:  Marinda Elk, M.D.DATE OF BIRTH:  20-Feb-1956  DATE OF ADMISSION:  03/27/2011 DATE OF DISCHARGE:  03/29/2011                              DISCHARGE SUMMARY   PRIMARY CARE DOCTOR:  Jackie Plum, MD  DISCHARGE DIAGNOSES: 1. Acute asthma exacerbation. 2. Hypertension. 3. Diabetes type 2. 4. Bilateral leg swelling.  DISCHARGE MEDICATIONS: 1. Amlodipine 10 mg daily. 2. Hydrochlorothiazide 12.5 mg daily. 3. Prednisone 10 mg tapered. 4. Lantus 85 units twice a daily. 5. Albuterol inhaler 2 puffs q.4 h. p.r.n. 6. Aspirin 81 mg daily. 7. Humulin 20 units subcutaneous t.i.d. 8. Metformin 500 mg b.i.d. 9. Quinapril 40 mg daily. 10.Simvastatin 40 mg daily. 11.Toprol-XL 100 mg daily.  PROCEDURES PERFORMED:  Chest x-ray is showing increase in pulmonary edema.  CONSULTANTS:  None.  BRIEF ADMITTING HISTORY AND PHYSICAL:  This 55 year old female with past medical history of asthma that comes in for 1 month of shortness of breath.  She also described history of lower extremity swelling, but stated that this started after she was complaining of a nail sore, treated by her primary care doctor.  She states that the swelling in her feet has been waxing and waning and her shortness of breath symptoms have also been waxing and waning.  She was never been intubated.  Please refer to dictation from March 27, 2011, for further details.  BRIEF HOSPITAL COURSE: 1. Acute asthma exacerbation.  She was admitted to the floor, was     started on high-flow oxygen, IV steroids, and albuterol.  Her     shortness of breath improved.  Chest x-ray showed little bit of     slight pulmonary vascular congestion.  The patient had not been     taking her medication and she was given 1 dose of Lasix with no     improvement in her  shortness of breath, but by the next day her     shortness of breath improved.  She was tapered down on her steroids     and she continued to do quite well.  She was not started on     antibiotics as she did not have a fever, no infiltrate or no cough.     So, she will go home on steroid taper and albuterol. 2. Hypertension. On admission, it was really high.  She was not taking     her medication.  She was started on Norvasc and     hydrochlorothiazide.  She was given 1 dose of Lasix to remove the     excess fluid.  Her blood pressure on the day of discharge was     150/80.  She was given prescription for hydrochlorothiazide and     Norvasc.  She will follow up with her primary care doctor and     titrate medications as needed. 3. Diabetes type 2.  These were high on admission and her hemoglobin     A1c was 8.2.  Her Lantus was increased to 85 in the hospital.  It  was high secondary to steroids.  Her steroids have been titrating     down.  No change besides increase of her Lantus, no other changes     were made to her diabetes regimen. She is going home on a steroid     taper, as she tapers off her blood glucose will be much improved. 4. Bilateral leg swelling.  She had no pain and she was able to walk.     She was given 1 dose of Lasix.  Her swelling had some improvement,     but not significant.  She had no redness to contribute to this.     She will follow up with her primary care doctor.  DISPOSITION:  The patient will follow up with her primary care doctor in 1 or 2 weeks.  Here, we will see how her swelling is doing.  Also titrate her blood pressure medications as needed.  Also consider increasing her hydrochlorothiazide and get a 2-D echo as an outpatient.  On the day of discharge, her temperature is 96, pulse 71, respiration 16, blood pressure 150/80, she was saturating 95% on 2 L.  Labs on the day of discharge show hemoglobin A1c of 8.2.     Marinda Elk,  M.D.     AF/MEDQ  D:  03/29/2011  T:  03/29/2011  Job:  [id number]  Electronically Signed by Marinda Elk M.D. on 03/30/2011 03:16:13 PM

## 2011-05-19 LAB — BASIC METABOLIC PANEL
BUN: 30 — ABNORMAL HIGH
CO2: 23
CO2: 24
Chloride: 93 — ABNORMAL LOW
Chloride: 98
Creatinine, Ser: 1.39 — ABNORMAL HIGH
Creatinine, Ser: 1.61 — ABNORMAL HIGH
GFR calc Af Amer: 40 — ABNORMAL LOW
Glucose, Bld: 400 — ABNORMAL HIGH
Potassium: 3.8
Potassium: 4
Sodium: 129 — ABNORMAL LOW

## 2011-05-19 LAB — DIFFERENTIAL
Basophils Absolute: 0
Basophils Relative: 0
Eosinophils Absolute: 0
Eosinophils Relative: 0
Eosinophils Relative: 1
Lymphocytes Relative: 22
Lymphs Abs: 1.7
Monocytes Absolute: 1.1 — ABNORMAL HIGH
Monocytes Relative: 9
Neutro Abs: 8.1 — ABNORMAL HIGH

## 2011-05-19 LAB — CBC
HCT: 36.1
HCT: 37.8
Hemoglobin: 12.1
MCHC: 32.1
MCHC: 32.7
MCV: 84.7
Platelets: 306
RBC: 4.48
RDW: 13.9
RDW: 14.3

## 2011-05-19 LAB — GLUCOSE, RANDOM
Glucose, Bld: 434 — ABNORMAL HIGH
Glucose, Bld: 479 — ABNORMAL HIGH

## 2011-05-19 LAB — HEMOGLOBIN A1C: Mean Plasma Glucose: 304

## 2011-05-19 LAB — CARDIAC PANEL(CRET KIN+CKTOT+MB+TROPI)
Troponin I: 0.03
Troponin I: 0.04

## 2011-05-19 LAB — CK TOTAL AND CKMB (NOT AT ARMC): Relative Index: 0.5

## 2011-05-19 LAB — TROPONIN I: Troponin I: 0.03

## 2011-08-04 ENCOUNTER — Inpatient Hospital Stay (HOSPITAL_COMMUNITY)
Admission: EM | Admit: 2011-08-04 | Discharge: 2011-08-10 | DRG: 091 | Disposition: A | Discharge status: Home / Self Care | Payer: Medicaid Other | Attending: Internal Medicine | Admitting: Internal Medicine

## 2011-08-04 ENCOUNTER — Emergency Department (HOSPITAL_COMMUNITY): Payer: Medicaid Other

## 2011-08-04 ENCOUNTER — Other Ambulatory Visit: Payer: Self-pay

## 2011-08-04 ENCOUNTER — Inpatient Hospital Stay (HOSPITAL_COMMUNITY): Payer: Medicaid Other

## 2011-08-04 ENCOUNTER — Encounter: Payer: Self-pay | Admitting: Emergency Medicine

## 2011-08-04 DIAGNOSIS — J984 Other disorders of lung: Secondary | ICD-10-CM | POA: Insufficient documentation

## 2011-08-04 DIAGNOSIS — Z794 Long term (current) use of insulin: Secondary | ICD-10-CM

## 2011-08-04 DIAGNOSIS — E1101 Type 2 diabetes mellitus with hyperosmolarity with coma: Secondary | ICD-10-CM | POA: Diagnosis present

## 2011-08-04 DIAGNOSIS — G92 Toxic encephalopathy: Principal | ICD-10-CM | POA: Diagnosis present

## 2011-08-04 DIAGNOSIS — I639 Cerebral infarction, unspecified: Secondary | ICD-10-CM

## 2011-08-04 DIAGNOSIS — J9600 Acute respiratory failure, unspecified whether with hypoxia or hypercapnia: Secondary | ICD-10-CM | POA: Diagnosis not present

## 2011-08-04 DIAGNOSIS — E860 Dehydration: Secondary | ICD-10-CM | POA: Diagnosis present

## 2011-08-04 DIAGNOSIS — E871 Hypo-osmolality and hyponatremia: Secondary | ICD-10-CM | POA: Diagnosis present

## 2011-08-04 DIAGNOSIS — R000 Tachycardia, unspecified: Secondary | ICD-10-CM | POA: Diagnosis present

## 2011-08-04 DIAGNOSIS — G9341 Metabolic encephalopathy: Secondary | ICD-10-CM | POA: Diagnosis present

## 2011-08-04 DIAGNOSIS — I10 Essential (primary) hypertension: Secondary | ICD-10-CM

## 2011-08-04 DIAGNOSIS — R739 Hyperglycemia, unspecified: Secondary | ICD-10-CM

## 2011-08-04 DIAGNOSIS — E876 Hypokalemia: Secondary | ICD-10-CM | POA: Diagnosis not present

## 2011-08-04 DIAGNOSIS — J9601 Acute respiratory failure with hypoxia: Secondary | ICD-10-CM | POA: Diagnosis present

## 2011-08-04 DIAGNOSIS — E785 Hyperlipidemia, unspecified: Secondary | ICD-10-CM | POA: Insufficient documentation

## 2011-08-04 DIAGNOSIS — J45909 Unspecified asthma, uncomplicated: Secondary | ICD-10-CM | POA: Diagnosis present

## 2011-08-04 DIAGNOSIS — Z6841 Body Mass Index (BMI) 40.0 and over, adult: Secondary | ICD-10-CM

## 2011-08-04 DIAGNOSIS — J449 Chronic obstructive pulmonary disease, unspecified: Secondary | ICD-10-CM | POA: Insufficient documentation

## 2011-08-04 DIAGNOSIS — M6282 Rhabdomyolysis: Secondary | ICD-10-CM | POA: Diagnosis present

## 2011-08-04 DIAGNOSIS — G929 Unspecified toxic encephalopathy: Principal | ICD-10-CM | POA: Diagnosis present

## 2011-08-04 DIAGNOSIS — J189 Pneumonia, unspecified organism: Secondary | ICD-10-CM | POA: Diagnosis not present

## 2011-08-04 DIAGNOSIS — E1100 Type 2 diabetes mellitus with hyperosmolarity without nonketotic hyperglycemic-hyperosmolar coma (NKHHC): Secondary | ICD-10-CM | POA: Diagnosis present

## 2011-08-04 DIAGNOSIS — R509 Fever, unspecified: Secondary | ICD-10-CM | POA: Diagnosis present

## 2011-08-04 DIAGNOSIS — D72829 Elevated white blood cell count, unspecified: Secondary | ICD-10-CM | POA: Diagnosis present

## 2011-08-04 HISTORY — DX: Hyperlipidemia, unspecified: E78.5

## 2011-08-04 HISTORY — DX: Unspecified asthma, uncomplicated: J45.909

## 2011-08-04 HISTORY — DX: Chronic obstructive pulmonary disease, unspecified: J44.9

## 2011-08-04 HISTORY — DX: Morbid (severe) obesity due to excess calories: E66.01

## 2011-08-04 LAB — POCT I-STAT, CHEM 8
BUN: 10 mg/dL (ref 6–23)
Hemoglobin: 16.7 g/dL — ABNORMAL HIGH (ref 12.0–15.0)
Sodium: 137 mEq/L (ref 135–145)
TCO2: 28 mmol/L (ref 0–100)

## 2011-08-04 LAB — GLUCOSE, CAPILLARY
Glucose-Capillary: 417 mg/dL — ABNORMAL HIGH (ref 70–99)
Glucose-Capillary: 384 mg/dL — ABNORMAL HIGH (ref 70–99)
Glucose-Capillary: 308 mg/dL — ABNORMAL HIGH (ref 70–99)
Glucose-Capillary: 278 mg/dL — ABNORMAL HIGH (ref 70–99)
Glucose-Capillary: 192 mg/dL — ABNORMAL HIGH (ref 70–99)
Glucose-Capillary: 165 mg/dL — ABNORMAL HIGH (ref 70–99)

## 2011-08-04 LAB — DIFFERENTIAL
Eosinophils Relative: 0 % (ref 0–5)
Lymphocytes Relative: 9 % — ABNORMAL LOW (ref 12–46)
Lymphs Abs: 1 10*3/uL (ref 0.7–4.0)

## 2011-08-04 LAB — URINALYSIS, ROUTINE W REFLEX MICROSCOPIC
Bilirubin Urine: NEGATIVE
Ketones, ur: 40 mg/dL — AB
Nitrite: NEGATIVE
Protein, ur: 100 mg/dL — AB
pH: 5 (ref 5.0–8.0)

## 2011-08-04 LAB — CARDIAC PANEL(CRET KIN+CKTOT+MB+TROPI)
CK, MB: 4.1 ng/mL — ABNORMAL HIGH (ref 0.3–4.0)
Relative Index: 0.6 (ref 0.0–2.5)
Total CK: 640 U/L — ABNORMAL HIGH (ref 7–177)
Troponin I: <0.3 ng/mL (ref <0.30)

## 2011-08-04 LAB — CBC
Hemoglobin: 13.8 g/dL (ref 12.0–15.0)
MCV: 82.4 fL (ref 78.0–100.0)
Platelets: 305 10*3/uL (ref 150–400)
RBC: 5.28 MIL/uL — ABNORMAL HIGH (ref 3.87–5.11)
WBC: 11.2 10*3/uL — ABNORMAL HIGH (ref 4.0–10.5)

## 2011-08-04 LAB — COMPREHENSIVE METABOLIC PANEL
ALT: 14 U/L (ref 0–35)
Alkaline Phosphatase: 191 U/L — ABNORMAL HIGH (ref 39–117)
CO2: 24 mEq/L (ref 19–32)
GFR calc Af Amer: 83 mL/min — ABNORMAL LOW (ref >90)
GFR calc non Af Amer: 72 mL/min — ABNORMAL LOW (ref >90)
Glucose, Bld: 530 mg/dL — ABNORMAL HIGH (ref 70–99)
Potassium: 3.6 mEq/L (ref 3.5–5.1)
Sodium: 134 mEq/L — ABNORMAL LOW (ref 135–145)

## 2011-08-04 LAB — PROTIME-INR: Prothrombin Time: 13.5 seconds (ref 11.6–15.2)

## 2011-08-04 LAB — INFLUENZA PANEL BY PCR (TYPE A & B)
H1N1 flu by pcr: NOT DETECTED
Influenza B By PCR: NEGATIVE

## 2011-08-04 LAB — URINE MICROSCOPIC-ADD ON

## 2011-08-04 MED ORDER — DEXTROSE 5 % IV SOLN: INTRAVENOUS | Status: DC

## 2011-08-04 MED ORDER — SODIUM CHLORIDE 0.9 % IV SOLN
INTRAVENOUS | Status: DC
  Administered 2011-08-04: 3.6 [IU]/h via INTRAVENOUS
  Filled 2011-08-04: qty 1

## 2011-08-04 MED ORDER — INSULIN ASPART 100 UNIT/ML ~~LOC~~ SOLN
SUBCUTANEOUS | Status: AC
  Filled 2011-08-04: qty 1

## 2011-08-04 MED ORDER — HYDRALAZINE HCL 20 MG/ML IJ SOLN
10.0000 mg | Freq: Once | INTRAMUSCULAR | Status: AC
  Administered 2011-08-04: 20 mg via INTRAVENOUS
  Filled 2011-08-04: qty 0.5

## 2011-08-04 MED ORDER — INSULIN ASPART 100 UNIT/ML IV SOLN
10.0000 [IU] | Freq: Once | INTRAVENOUS | Status: AC
  Administered 2011-08-04: 10 [IU] via INTRAVENOUS
  Filled 2011-08-04: qty 0.1

## 2011-08-04 MED ORDER — OSELTAMIVIR PHOSPHATE 75 MG PO CAPS
75.0000 mg | ORAL_CAPSULE | Freq: Two times a day (BID) | ORAL | Status: DC
  Filled 2011-08-04 (×3): qty 1

## 2011-08-04 MED ORDER — INSULIN REGULAR BOLUS VIA INFUSION
0.0000 [IU] | Freq: Three times a day (TID) | INTRAVENOUS | Status: DC
  Filled 2011-08-04: qty 10

## 2011-08-04 MED ORDER — INSULIN REGULAR HUMAN 100 UNIT/ML IJ SOLN
10.0000 [IU] | Freq: Once | INTRAMUSCULAR | Status: DC
  Filled 2011-08-04: qty 0.1

## 2011-08-04 MED ORDER — SODIUM CHLORIDE 0.9 % IV BOLUS (SEPSIS)
500.0000 mL | Freq: Once | INTRAVENOUS | Status: AC
  Administered 2011-08-04: 1000 mL via INTRAVENOUS

## 2011-08-04 MED ORDER — ACETAMINOPHEN 650 MG RE SUPP
650.0000 mg | RECTAL | Status: DC | PRN
  Administered 2011-08-05: 650 mg via RECTAL
  Filled 2011-08-04: qty 1

## 2011-08-04 MED ORDER — DEXTROSE-NACL 5-0.45 % IV SOLN
INTRAVENOUS | Status: DC
  Administered 2011-08-04: 21:00:00 via INTRAVENOUS

## 2011-08-04 MED ORDER — HYDRALAZINE HCL 20 MG/ML IJ SOLN
10.0000 mg | Freq: Four times a day (QID) | INTRAMUSCULAR | Status: DC | PRN
  Administered 2011-08-05: 10 mg via INTRAVENOUS
  Filled 2011-08-04: qty 0.5

## 2011-08-04 MED ORDER — LORAZEPAM 2 MG/ML IJ SOLN
0.5000 mg | Freq: Once | INTRAMUSCULAR | Status: AC
  Administered 2011-08-04: 0.5 mg via INTRAVENOUS
  Filled 2011-08-04: qty 1

## 2011-08-04 MED ORDER — SIMVASTATIN 40 MG PO TABS
40.0000 mg | ORAL_TABLET | Freq: Every day | ORAL | Status: DC
  Administered 2011-08-06 – 2011-08-09 (×4): 40 mg via ORAL
  Filled 2011-08-04 (×6): qty 1

## 2011-08-04 MED ORDER — METOPROLOL TARTRATE 100 MG PO TABS
100.0000 mg | ORAL_TABLET | Freq: Every day | ORAL | Status: DC
  Administered 2011-08-05 – 2011-08-10 (×6): 100 mg via ORAL
  Filled 2011-08-04 (×6): qty 1

## 2011-08-04 MED ORDER — INSULIN REGULAR BOLUS VIA INFUSION
0.0000 [IU] | Freq: Three times a day (TID) | INTRAVENOUS | Status: DC
  Filled 2011-08-04 (×2): qty 10

## 2011-08-04 MED ORDER — SODIUM CHLORIDE 0.9 % IV SOLN
INTRAVENOUS | Status: DC
  Administered 2011-08-04 (×2): via INTRAVENOUS

## 2011-08-04 MED ORDER — LABETALOL HCL 5 MG/ML IV SOLN: 10.0000 mg | Freq: Once | INTRAVENOUS | Status: DC

## 2011-08-04 MED ORDER — HALOPERIDOL LACTATE 5 MG/ML IJ SOLN: 2.0000 mg | Freq: Four times a day (QID) | INTRAMUSCULAR | Status: DC | PRN

## 2011-08-04 MED ORDER — DEXTROSE-NACL 5-0.45 % IV SOLN: INTRAVENOUS | Status: DC

## 2011-08-04 MED ORDER — SODIUM CHLORIDE 0.9 % IV SOLN
INTRAVENOUS | Status: DC
  Administered 2011-08-04: 7.4 [IU]/h via INTRAVENOUS
  Filled 2011-08-04 (×2): qty 1

## 2011-08-04 MED ORDER — DEXTROSE 50 % IV SOLN: 25.0000 mL | INTRAVENOUS | Status: DC | PRN

## 2011-08-04 MED ORDER — SODIUM CHLORIDE 0.9 % IV SOLN: INTRAVENOUS | Status: DC

## 2011-08-04 NOTE — ED Notes (Signed)
ekg has been given to dr. Estell Harpin

## 2011-08-04 NOTE — ED Notes (Signed)
cbg is 417 glucometer.

## 2011-08-04 NOTE — ED Notes (Signed)
Pt agitated, restless, moving about in bed side to side continuously. Sitter at bedside.

## 2011-08-04 NOTE — Progress Notes (Signed)
Pt. Arrived from ED lethargic and unable to follow commands. Can not comprehend speech. Temp 102.7 axillary, BP 178/89, 02 95% 2L, HR 125, Resp 40. Attempted to notify MD but was unsuccessful. Rapid response nurse notified. Attempted to notify MD again and received order to transfer to ICU. Continued to monitor through rest of shift until report was passed to oncoming nurse.

## 2011-08-04 NOTE — ED Notes (Signed)
Patient transported to CT with RN and sitter.

## 2011-08-04 NOTE — ED Notes (Signed)
CBG 384 

## 2011-08-04 NOTE — ED Provider Notes (Signed)
History     CSN: [id number] Arrival date & time: 08/04/2011 11:21 AM   First MD Initiated Contact with Patient 08/04/11 1135      Chief Complaint  Patient presents with  . Altered Mental Status    (Consider location/radiation/quality/duration/timing/severity/associated sxs/prior treatment) Patient is a 55 y.o. female presenting with altered mental status. The history is provided by the EMS personnel.  Altered Mental Status This is a new problem. Episode onset: The patient was found by a neighbor sitting in a chair and unable to talk. Time of onset is unknown. The problem occurs constantly. Associated symptoms comments: The patient cannot provide any detail of history..  She arrives via EMS, answers "yes" to any and all questions, and does not follow command.   Past Medical History  Diagnosis Date  . Diabetes mellitus   . Morbid obesity   . Obstructive airway disease   . Hyperlipidemia   . Reactive airway disease     Past Surgical History  Procedure Date  . Abdominal hysterectomy   . Eye surgery     No family history on file.  History  Substance Use Topics  . Smoking status: Not on file  . Smokeless tobacco: Not on file  . Alcohol Use:     OB History    Grav Para Term Preterm Abortions TAB SAB Ect Mult Living                  Review of Systems  Unable to perform ROS Psychiatric/Behavioral: Positive for altered mental status.    Allergies  Amlodipine; Azor; and Thiazide-type diuretics  Home Medications   Current Outpatient Rx  Name Route Sig Dispense Refill  . MOMETASONE FUROATE 50 MCG/ACT NA SUSP Nasal Place 2 sprays into the nose at bedtime.        BP 196/125  Pulse 94  Temp(Src) 100.4 F (38 C) (Oral)  Resp 34  SpO2 99%  Physical Exam  Constitutional: She appears well-developed and well-nourished.  HENT:  Head: Normocephalic and atraumatic.  Eyes: Right pupil is not reactive.  Neck: Normal range of motion.  Cardiovascular: Normal rate and  normal heart sounds.   Pulmonary/Chest: Tachypnea noted.       Mildly labored breathing, improves with slight incline.   Musculoskeletal:       Right upper extremity held in rigid flexion. Able to move other three extremities.  Neurological:       The patient responds to her name with incoherent response. She does make eye contact, follows movement. She has voluntary coordination in left hand, but does not follow commands.  Skin: She is not diaphoretic.    ED Course  Procedures (including critical care time)  Labs Reviewed  CBC - Abnormal; Notable for the following:    WBC 11.2 (*)    RBC 5.28 (*)    All other components within normal limits  DIFFERENTIAL - Abnormal; Notable for the following:    Neutrophils Relative 90 (*)    Neutro Abs 10.0 (*)    Lymphocytes Relative 9 (*)    Monocytes Relative 1 (*)    All other components within normal limits  CK TOTAL AND CKMB - Abnormal; Notable for the following:    CK, MB 4.6 (*)    All other components within normal limits  POCT I-STAT, CHEM 8 - Abnormal; Notable for the following:    Glucose, Bld 555 (*)    Calcium, Ion 1.08 (*)    Hemoglobin 16.7 (*)  HCT 49.0 (*)    All other components within normal limits  PROTIME-INR  APTT  TROPONIN I  I-STAT, CHEM 8  COMPREHENSIVE METABOLIC PANEL  POCT CBG MONITORING  URINALYSIS, ROUTINE W REFLEX MICROSCOPIC  URINE CULTURE   No results found.   No diagnosis found.    MDM  Head CT negative for acute finding. MR ordered, but question ability to do study due to body habitus. Patient's hyperglycemia and hypertension addressed. [facility] paged for unassigned admission.        Rodena Medin, PA 08/04/11 [phone]

## 2011-08-04 NOTE — ED Provider Notes (Signed)
Medical screening examination/treatment/procedure(s) were performed by non-physician practitioner and as supervising physician I was immediately available for consultation/collaboration.   Jesstin Studstill L Ann Groeneveld, MD 08/04/11 1609 

## 2011-08-04 NOTE — ED Notes (Addendum)
Per EMS patient was found by neighbor just sitting and mumbling .   Pt uinable to speak to EMS, unable to follow command . Speech slurred and mummbled.  Right arm weakness, dead weight per EMT. Possible drooping on right side. Left pupil non reactive. Lives in apartment building at [facility] . Unknown when last encounter with patient was.

## 2011-08-04 NOTE — ED Notes (Signed)
12:09pm- glucometer 520.

## 2011-08-04 NOTE — ED Notes (Signed)
Called to give report to [place], RN for transfer to [id number] [id number]. RN to call back in about 10 minutes; with another patient at present.

## 2011-08-04 NOTE — Progress Notes (Signed)
Called to asssit with patient admitted from ED.  On arrival patient lethargic opens eyes - moans with some attempt at speech - incomprehensible - mae times 4 - purposeful- tachycardiac at 125 resps 38  O2 sat 95% on 2 liter [place] - abdominal breathing - large body habitus - bil BS present - distant - few rhonchi noted - hot to touch- mucus membranes very dry - axillary temp 103 - BP 178/89.  MD Dr. Marda Stalker paged to bedside - orders given - transfer order placed by MD.  Patient restless - sitter at bedside.  MD present.  Care returned to 3000 RN - Caryl Asp.  RN to call as needed.

## 2011-08-04 NOTE — ED Notes (Signed)
Patient repeatedly trying to sit up in bed and restlessly turning side to side. Sitter requested since patient trying to get OOB and does not follow commands.

## 2011-08-04 NOTE — ED Notes (Signed)
Mariel Aloe, nurse tech at bedside as sitter for patient safety.

## 2011-08-04 NOTE — ED Notes (Signed)
CBG checked. Results 520. MD aware

## 2011-08-04 NOTE — ED Notes (Signed)
CBG: 417 

## 2011-08-04 NOTE — H&P (Signed)
Hospital Admission Note Date: 08/04/2011  PCP: No primary provider on file.  Chief Complaint: Encephalopathy  History of Present Illness: This is a 55 year old with past medical history of diabetes, hypertension, and asthma that comes in for altered mental status. As per ED notes and there is no family members at the bedside, have tried to call the son Marich,Timothy Son [phone] with no answer. The patient was found by a neighbor sitting in a chair unable to talk, about the time of onset is unknown at its symptoms. She arrives by EMS answer yes and no questions. It is not following commands. In she seems by physical exam dry. The patient is not able to give any history and mumbling.   Allergies: Amlodipine; Azor; and Thiazide-type diuretics Past Medical History  Diagnosis Date  . Diabetes mellitus   . Morbid obesity   . Obstructive airway disease   . Hyperlipidemia   . Reactive airway disease    Prior to Admission medications   Medication Sig Start Date End Date Taking? Authorizing Provider  albuterol (PROVENTIL HFA;VENTOLIN HFA) 108 (90 BASE) MCG/ACT inhaler Inhale 1 puff into the lungs every 4 (four) hours as needed. For shortness of breath.    Yes Historical Provider, MD  insulin glargine (LANTUS) 100 UNIT/ML injection Inject 75 Units into the skin 2 (two) times daily.     Yes Historical Provider, MD  metFORMIN (GLUCOPHAGE) 500 MG tablet Take 1,000 mg by mouth 2 (two) times daily with a meal.     Yes Historical Provider, MD  metoprolol (LOPRESSOR) 100 MG tablet Take 100 mg by mouth daily.     Yes Historical Provider, MD  mometasone (NASONEX) 50 MCG/ACT nasal spray Place 2 sprays into the nose at bedtime.     Yes Historical Provider, MD  quinapril (ACCUPRIL) 40 MG tablet Take 40 mg by mouth daily.     Yes Historical Provider, MD  simvastatin (ZOCOR) 40 MG tablet Take 40 mg by mouth at bedtime.     Yes Historical Provider, MD   Past Surgical History  Procedure Date  . Abdominal  hysterectomy   . Eye surgery    No family history on file. History   Social History  . Marital Status: Single    Spouse Name: N/A    Number of Children: N/A  . Years of Education: N/A   Occupational History  . Not on file.   Social History Main Topics  . Smoking status: Not on file  . Smokeless tobacco: Not on file  . Alcohol Use:   . Drug Use:   . Sexually Active:    Other Topics Concern  . Not on file   Social History Narrative  . No narrative on file   Review of Systems: Pertinent items are noted in HPI. Physical Exam: Filed Vitals:   08/04/11 1641 08/04/11 1645 08/04/11 1700 08/04/11 1800  BP: 176/91  156/47 179/62  Pulse: 117 108 110 113  Temp:    102.7 F (39.3 C)  TempSrc:    Axillary  Resp: 26   49  SpO2: 96% 97% 95% 95%    Intake/Output Summary (Last 24 hours) at 08/04/11 1859 Last data filed at 08/04/11 1710  Gross per 24 hour  Intake    700 ml  Output   1000 ml  Net   -300 ml   BP 179/62  Pulse 113  Temp(Src) 102.7 F (39.3 C) (Axillary)  Resp 49  SpO2 95%  General Appearance:    totally confused  not able to carry on a conversation only answer yes and no questions.   Head:    Normocephalic, without obvious abnormality, atraumatic  Eyes:    PERRL, conjunctiva/corneas clear, EOM's intact, fundi    benign, both eyes  Ears:    Normal TM's and external ear canals, both ears  Nose:   Nares normal, septum midline, mucosa normal, no drainage    or sinus tenderness  Throat:   dry mucous membranes and lips   Neck:   Supple, symmetrical, trachea midline, no adenopathy;    thyroid:  no enlargement/tenderness/nodules; no carotid   bruit or JVD  Back:     Symmetric, no curvature, ROM normal, no CVA tenderness  Lungs:     good air movement and clear to auscultation.   Chest Wall:    No tenderness or deformity   Heart:    distant heart sounds but regular rate tachycardic with a positive S1 and appreciated murmurs or gallops.   Breast Exam:    No  tenderness, masses, or nipple abnormality  Abdomen:     Soft, non-tender, bowel sounds active all four quadrants,    no masses, no organomegaly  Genitalia:    Normal female without lesion, discharge or tenderness  Rectal:    Normal tone, normal prostate, no masses or tenderness;   guaiac negative stool  Extremities:   Extremities normal, atraumatic, no cyanosis or edema  Pulses:   2+ and symmetric all extremities  Skin:   Skin color, texture, turgor normal, no rashes or lesions  Lymph nodes:   Cervical, supraclavicular, and axillary nodes normal  Neurologic:  not able to perform to the patient's lack of cooperation. She is moving all 4 extremity is without any difficulties.    Lab results:  [facility] 08/04/11 1214 08/04/11 1140  NA 137 134*  K 3.7 3.6  CL 96 92*  CO2 -- 24  GLUCOSE 555* 530*  BUN 10 10  CREATININE 1.00 0.88  CALCIUM -- 9.7  MG -- --  PHOS -- --    Basename 08/04/11 1140  AST 17  ALT 14  ALKPHOS 191*  BILITOT 0.4  PROT 9.1*  ALBUMIN 4.1   No results found for this basename: LIPASE:2,AMYLASE:2 in the last 72 hours  Basename 08/04/11 1214 08/04/11 1140  WBC -- 11.2*  NEUTROABS -- 10.0*  HGB 16.7* 13.8  HCT 49.0* 43.5  MCV -- 82.4  PLT -- 305    Basename 08/04/11 1140  CKTOTAL 357*  CKMB 4.6*  CKMBINDEX --  TROPONINI <0.30   Imaging results:  Ct Head Wo Contrast  08/04/2011  *RADIOLOGY REPORT*  Clinical Data:  Altered mental status.  Found sitting in a chair, mumbling.  Left pupil non reactive.  Right arm weakness.  CT HEAD WITHOUT CONTRAST  Technique:  Contiguous axial images were obtained from the base of the skull through the vertex without contrast.  Comparison: 08/17/2005  Findings: There is mild central and cortical atrophy. Periventricular white matter changes are present.  No intra or extra-axial fluid collection or mass.  Basilar cisterns and ventricles have a normal appearance.  Bone windows show air fluid level within the left maxillary  sinus. There is mucosal thickening of the ethmoid air cells.  No calvarial fracture.  IMPRESSION: 1.  Mild atrophy and small vessel disease. 2. No evidence for acute intracranial abnormality. 3.  Chronic sinusitis. 4.  Suspect left maxillary acute sinusitis.  Original Report Authenticated By: Patterson Hammersmith, M.D.   Other results: EKG:  Pending at the time of this dictation.   Patient Active Hospital Problem List: Hyperosmolar nonketotic coma in diabetes (08/04/2011) Limited to step down unit started on the Glucomander. And continue to monitor her CBGs every 2 hours and basic metabolic panel every 4 hours. Once her glucoses below 250 PERRL start her on D5 and continue normal saline going at a different IV site. As she seemed significantly dehydrated by physical exam and by labs.   Metabolic encephalopathy (08/04/2011) It seems that this most likely looks like an infectious etiology contributing to the encephalopathy. CT head shows no acute intracranial hemorrhage, some mild atrophy and small vessel disease. At this time an MRI of the head cannot be done by the ED. Due to the patient not being able to stay still. So we'll use Haldol when necessary for any agitation. And try to control her glucose and IV hydrate her aggressively which can improve this encephalopathy.  Fever (08/04/2011) UA shows white blood cells and a few bacteria. She did spike a fever here in the ED about 102.7. We'll get an influenza PCR, check a chest x-ray. And will start on Tamiflu empirically. At this time she'll have any bed sores or cuts she has a mild leukocytosis. We'll also send for blood cultures. Will hold on antibiotics at this time. As her blood pressure seems stable. She is was just slightly tachycardic which is probably secondary to being dehydrated.  Leukocytosis (leucocytosis) (08/04/2011) This probably secondary to her ongoing infection. We'll start her on Tamiflu empirically. Will do blood cultures and urine  cultures and chest x-ray. And will followup with his results. If the chest x-ray shows an infiltrate we'll start her on empiric antibiotic treatment.  Tachycardia - pulse (08/04/2011) Will started on IV fluids aggressively. We'll continue her beta blocker. We'll hold her blood pressure medications. Annual continue to monitor in telemetry. We'll also cycle her cardiac enzymes x3. And also check an EKG .  Hyponatremia (08/04/2011) Is a component of pseudohyponatremia. As her blood glucose is 130 was corrected is within normal limits. But she is significantly dehydrated her chloride is low. Her creatinine is at baseline 1.0. Neck spasm tablet 55-60% with diastolic heart dysfunction. We'll start on IV fluids aggressively. Monitor strict I.'s and O.'s. Check urinary sodium urinary creatinine. This might be unreliable. As she got a liter of IV fluid normal saline the ED. But she does seems dehydrated by physical exam.   Marinda Elk M.D. [facility] [phone] 08/04/2011, 6:59 PM

## 2011-08-05 ENCOUNTER — Inpatient Hospital Stay (HOSPITAL_COMMUNITY): Payer: Medicaid Other

## 2011-08-05 DIAGNOSIS — E860 Dehydration: Secondary | ICD-10-CM | POA: Diagnosis present

## 2011-08-05 DIAGNOSIS — J9601 Acute respiratory failure with hypoxia: Secondary | ICD-10-CM | POA: Diagnosis present

## 2011-08-05 DIAGNOSIS — M6282 Rhabdomyolysis: Secondary | ICD-10-CM | POA: Diagnosis present

## 2011-08-05 LAB — GLUCOSE, CAPILLARY
Glucose-Capillary: 151 mg/dL — ABNORMAL HIGH (ref 70–99)
Glucose-Capillary: 158 mg/dL — ABNORMAL HIGH (ref 70–99)
Glucose-Capillary: 164 mg/dL — ABNORMAL HIGH (ref 70–99)
Glucose-Capillary: 193 mg/dL — ABNORMAL HIGH (ref 70–99)
Glucose-Capillary: 239 mg/dL — ABNORMAL HIGH (ref 70–99)

## 2011-08-05 LAB — URINE CULTURE: Culture  Setup Time: 201212111300

## 2011-08-05 LAB — CBC
MCH: 25.2 pg — ABNORMAL LOW (ref 26.0–34.0)
MCV: 83 fL (ref 78.0–100.0)
Platelets: 321 10*3/uL (ref 150–400)
RBC: 4.88 MIL/uL (ref 3.87–5.11)

## 2011-08-05 LAB — HEMOGLOBIN A1C: Hgb A1c MFr Bld: 8.8 % — ABNORMAL HIGH (ref <5.7)

## 2011-08-05 LAB — BLOOD GAS, ARTERIAL
Acid-Base Excess: 3 mmol/L — ABNORMAL HIGH (ref 0.0–2.0)
Bicarbonate: 27.1 mEq/L — ABNORMAL HIGH (ref 20.0–24.0)
O2 Content: 3 L/min
O2 Saturation: 97.8 %
pO2, Arterial: 107 mmHg — ABNORMAL HIGH (ref 80.0–100.0)

## 2011-08-05 LAB — AMMONIA: Ammonia: 30 umol/L (ref 11–60)

## 2011-08-05 LAB — CARDIAC PANEL(CRET KIN+CKTOT+MB+TROPI): Relative Index: 0.3 (ref 0.0–2.5)

## 2011-08-05 LAB — RPR: RPR Ser Ql: NONREACTIVE

## 2011-08-05 LAB — VITAMIN B12: Vitamin B-12: 575 pg/mL (ref 211–911)

## 2011-08-05 MED ORDER — INSULIN GLARGINE 100 UNIT/ML ~~LOC~~ SOLN
75.0000 [IU] | Freq: Two times a day (BID) | SUBCUTANEOUS | Status: DC
  Administered 2011-08-05 – 2011-08-06 (×2): 75 [IU] via SUBCUTANEOUS

## 2011-08-05 MED ORDER — DEXTROSE 5 % IV SOLN
2.0000 g | Freq: Two times a day (BID) | INTRAVENOUS | Status: DC
  Administered 2011-08-06 – 2011-08-07 (×3): 2 g via INTRAVENOUS
  Filled 2011-08-05 (×4): qty 2

## 2011-08-05 MED ORDER — VANCOMYCIN HCL 1000 MG IV SOLR
1250.0000 mg | Freq: Two times a day (BID) | INTRAVENOUS | Status: DC
  Administered 2011-08-05 – 2011-08-07 (×5): 1250 mg via INTRAVENOUS
  Filled 2011-08-05 (×6): qty 1250

## 2011-08-05 MED ORDER — DEXTROSE 5 % IV SOLN
600.0000 mg | Freq: Three times a day (TID) | INTRAVENOUS | Status: DC
  Administered 2011-08-05: 600 mg via INTRAVENOUS
  Filled 2011-08-05 (×3): qty 12

## 2011-08-05 MED ORDER — CEFTRIAXONE SODIUM 2 G IJ SOLR
2.0000 g | INTRAMUSCULAR | Status: DC
  Administered 2011-08-05: 2 g via INTRAVENOUS
  Filled 2011-08-05: qty 2

## 2011-08-05 MED ORDER — INSULIN ASPART 100 UNIT/ML ~~LOC~~ SOLN
0.0000 [IU] | SUBCUTANEOUS | Status: DC
  Administered 2011-08-05: 4 [IU] via SUBCUTANEOUS
  Filled 2011-08-05: qty 3

## 2011-08-05 MED ORDER — PIPERACILLIN-TAZOBACTAM 3.375 G IVPB
3.3750 g | Freq: Three times a day (TID) | INTRAVENOUS | Status: DC
  Filled 2011-08-05 (×2): qty 50

## 2011-08-05 MED ORDER — INSULIN ASPART 100 UNIT/ML ~~LOC~~ SOLN
0.0000 [IU] | SUBCUTANEOUS | Status: DC
  Administered 2011-08-05: 4 [IU] via SUBCUTANEOUS
  Administered 2011-08-05: 7 [IU] via SUBCUTANEOUS
  Administered 2011-08-05: 4 [IU] via SUBCUTANEOUS
  Administered 2011-08-06: 7 [IU] via SUBCUTANEOUS
  Administered 2011-08-06 – 2011-08-07 (×3): 4 [IU] via SUBCUTANEOUS
  Administered 2011-08-07: 3 [IU] via SUBCUTANEOUS
  Filled 2011-08-05: qty 3

## 2011-08-05 MED ORDER — INSULIN GLARGINE 100 UNIT/ML ~~LOC~~ SOLN
75.0000 [IU] | Freq: Two times a day (BID) | SUBCUTANEOUS | Status: DC
  Filled 2011-08-05: qty 3

## 2011-08-05 MED ORDER — ACYCLOVIR SODIUM 50 MG/ML IV SOLN
550.0000 mg | Freq: Three times a day (TID) | INTRAVENOUS | Status: DC
  Administered 2011-08-05 – 2011-08-07 (×5): 550 mg via INTRAVENOUS
  Filled 2011-08-05 (×7): qty 11

## 2011-08-05 MED ORDER — SODIUM CHLORIDE 0.9 % IV SOLN
2.0000 g | INTRAVENOUS | Status: DC
  Administered 2011-08-05 – 2011-08-07 (×12): 2 g via INTRAVENOUS
  Filled 2011-08-05 (×15): qty 2000

## 2011-08-05 MED ORDER — INSULIN ASPART 100 UNIT/ML ~~LOC~~ SOLN: 0.0000 [IU] | SUBCUTANEOUS | Status: DC

## 2011-08-05 NOTE — Progress Notes (Signed)
Rocephin dose adjustment:  Since pt is being r/o for meningitis, I'll change the rocephin dose to q12h.  Plan  1. Rocephin 2gm IV q12

## 2011-08-05 NOTE — Progress Notes (Signed)
ANTIBIOTIC CONSULT NOTE - INITIAL  Pharmacy Consult for vancomycin + zosyn Indication: rule out pneumonia  Allergies  Allergen Reactions  . Amlodipine Other (See Comments)    unknown  . Azor (Amlodipine-Olmesartan) Swelling  . Thiazide-Type Diuretics Other (See Comments)    Renal issues    Patient Measurements: Height: 5\' 4"  (162.6 cm) Weight: 277 lb 5.4 oz (125.8 kg) IBW/kg (Calculated) : 54.7   Vital Signs: Temp: 100.5 F (38.1 C) (12/12 0400) Temp src: Oral (12/12 0400) BP: 147/57 mmHg (12/12 0400) Pulse Rate: 116  (12/12 0400) Intake/Output from previous day: 12/11 0701 - 12/12 0700 In: 1600 [I.V.:1600] Out: 1425 [Urine:1425] Intake/Output from this shift:    Labs:  Basename 08/05/11 0510 08/05/11 0025 08/04/11 1214 08/04/11 1140  WBC 15.1* -- -- 11.2*  HGB 12.3 -- 16.7* 13.8  PLT 321 -- -- 305  LABCREA -- 213.69 -- --  CREATININE -- -- 1.00 0.88   Estimated Creatinine Clearance: 81.4 ml/min (by C-G formula based on Cr of 1). No results found for this basename: VANCOTROUGH:2,VANCOPEAK:2,VANCORANDOM:2,GENTTROUGH:2,GENTPEAK:2,GENTRANDOM:2,TOBRATROUGH:2,TOBRAPEAK:2,TOBRARND:2,AMIKACINPEAK:2,AMIKACINTROU:2,AMIKACIN:2, in the last 72 hours   Microbiology: Recent Results (from the past 720 hour(s))  MRSA PCR SCREENING     Status: Normal   Collection Time   08/05/11 12:04 AM      Component Value Range Status Comment   MRSA by PCR NEGATIVE  NEGATIVE  Final     Medical History: Past Medical History  Diagnosis Date  . Diabetes mellitus   . Morbid obesity   . Obstructive airway disease   . Hyperlipidemia   . Reactive airway disease     Medications:  Prescriptions prior to admission  Medication Sig Dispense Refill  . albuterol (PROVENTIL HFA;VENTOLIN HFA) 108 (90 BASE) MCG/ACT inhaler Inhale 1 puff into the lungs every 4 (four) hours as needed. For shortness of breath.       . insulin glargine (LANTUS) 100 UNIT/ML injection Inject 75 Units into the skin 2  (two) times daily.        . metFORMIN (GLUCOPHAGE) 500 MG tablet Take 1,000 mg by mouth 2 (two) times daily with a meal.        . metoprolol (LOPRESSOR) 100 MG tablet Take 100 mg by mouth daily.        . mometasone (NASONEX) 50 MCG/ACT nasal spray Place 2 sprays into the nose at bedtime.        . quinapril (ACCUPRIL) 40 MG tablet Take 40 mg by mouth daily.        . simvastatin (ZOCOR) 40 MG tablet Take 40 mg by mouth at bedtime.         Assessment: 64 yof presented to the ED with AMS. Noted with fever and leukocytosis. Started on tamiflu and now adding broad-spectrum antibiotics for possible PNA.   Goal of Therapy:  Vancomycin trough level 15-20 mcg/ml  Plan:  Measure antibiotic drug levels at steady state Follow up culture results Vancomycin 1250mg  IV Q12H Zosyn 3.375gm IV Q8H (4 hour infusion)  Tennyson Wacha, Drake Leach 08/05/2011,7:18 AM

## 2011-08-05 NOTE — Progress Notes (Signed)
Subjective: The patient will arouse with combination of vocal and tactile stimulation. Patient opens eyes and makes eye contact but unfortunately answer is "yes" to all questions asked. Is unable to follow simple commands. She is moving all 4 extremities symmetrically and equally. No family in room.  Objective: Vital signs in last 24 hours: Temp:  [100.1 F (37.8 C)-102.7 F (39.3 C)] 100.1 F (37.8 C) (12/12 1200) Pulse Rate:  [104-123] 116  (12/12 0400) Resp:  [25-49] 37  (12/12 0400) BP: (142-180)/(42-141) 142/62 mmHg (12/12 1200) SpO2:  [95 %-100 %] 100 % (12/12 0400) Weight:  [125.8 kg (277 lb 5.4 oz)] 277 lb 5.4 oz (125.8 kg) (12/11 2233) Weight change:     Intake/Output from previous day: 12/11 0701 - 12/12 0700 In: 1700 [I.V.:1700] Out: 1425 [Urine:1425] Intake/Output this shift: Total I/O In: 450 [IV Piggyback:450] Out: 325 [Urine:325]  General appearance: appears older than stated age, agitated, and slowed mentation Resp: clear to auscultation bilaterally, non-labored effort and mild tachypnea; 2 L nasal cannula oxygen Cardio: Sinus tachycardia, no apparent JVD, S1-S2, normotensive, IV fluids at 100 cc per hour GI: Obese, soft, non-tender; bowel sounds normal; no masses,  no organomegaly Extremities: extremities normal, atraumatic, no cyanosis or edema Neurologic: Mainly lethargic but restless and agitated when stimulated. Opens eyes when awakened but unable to follow simple commands. Moving all extremities x4 without any obvious focal neurological deficits. Pupils are equal round and reactive to light and accommodation.  Lab Results:  Basename 08/05/11 0510 08/04/11 1214 08/04/11 1140  WBC 15.1* -- 11.2*  HGB 12.3 16.7* --  HCT 40.5 49.0* --  PLT 321 -- 305   BMET  Basename 08/04/11 1214 08/04/11 1140  NA 137 134*  K 3.7 3.6  CL 96 92*  CO2 -- 24  GLUCOSE 555* 530*  BUN 10 10  CREATININE 1.00 0.88  CALCIUM -- 9.7    Studies/Results: Ct Head Wo  Contrast  08/04/2011  *RADIOLOGY REPORT*  Clinical Data:  Altered mental status.  Found sitting in a chair, mumbling.  Left pupil non reactive.  Right arm weakness.  CT HEAD WITHOUT CONTRAST  Technique:  Contiguous axial images were obtained from the base of the skull through the vertex without contrast.  Comparison: 08/17/2005  Findings: There is mild central and cortical atrophy. Periventricular white matter changes are present.  No intra or extra-axial fluid collection or mass.  Basilar cisterns and ventricles have a normal appearance.  Bone windows show air fluid level within the left maxillary sinus. There is mucosal thickening of the ethmoid air cells.  No calvarial fracture.  IMPRESSION: 1.  Mild atrophy and small vessel disease. 2. No evidence for acute intracranial abnormality. 3.  Chronic sinusitis. 4.  Suspect left maxillary acute sinusitis.  Original Report Authenticated By: Patterson Hammersmith, M.D.   Dg Chest Port 1 View  08/05/2011  *RADIOLOGY REPORT*  Clinical Data: Fever.  Hypoxia.  Shortness breath.  PORTABLE CHEST - 1 VIEW  Comparison: 08/04/2011.  Findings: Respiratory degraded portable examination and with chin overlying the lung apex.  Rotation to the right.  No gross pneumothorax.  Cardiomegaly.  Pulmonary vascular congestion most notable centrally.  Basilar increased markings greater on the right.  Question infiltrate versus atelectasis.  IMPRESSION: Limited portable examination with cardiomegaly and pulmonary vascular congestion most notable centrally.  Basilar atelectasis versus infiltrate greater on the right.  Original Report Authenticated By: Fuller Canada, M.D.   Dg Chest Port 1 View  08/04/2011  *RADIOLOGY REPORT*  Clinical Data: Shortness of breath.  PORTABLE CHEST - 1 VIEW  Comparison: 03/27/2011  Findings: Cardiomegaly.  Bilateral perihilar and lower lobe opacities, most likely edema.  Recommend clinical correlation to exclude infection.  No effusions.  No acute bony  abnormality.  IMPRESSION: Bilateral perihilar and lower lobe opacities, most likely edema.  Original Report Authenticated By: Cyndie Chime, M.D.    Medications:  I have reviewed the patient's current medications. Scheduled:   . acyclovir  600 mg Intravenous Q8H  . ampicillin (OMNIPEN) IV  2 g Intravenous Q4H  . cefTRIAXone (ROCEPHIN)  IV  2 g Intravenous Q24H  . hydrALAZINE  10 mg Intravenous Once  . insulin aspart  0-20 Units Subcutaneous Q4H  . insulin glargine  75 Units Subcutaneous BID  . LORazepam  0.5 mg Intravenous Once  . metoprolol  100 mg Oral Daily  . simvastatin  40 mg Oral QHS  . vancomycin  1,250 mg Intravenous Q12H  . DISCONTD: insulin aspart  0-20 Units Subcutaneous Q4H  . DISCONTD: insulin aspart  0-20 Units Subcutaneous Q4H  . DISCONTD: insulin glargine  75 Units Subcutaneous BID  . DISCONTD: insulin regular  0-10 Units Intravenous TID WC  . DISCONTD: insulin regular  0-10 Units Intravenous TID WC  . DISCONTD: oseltamivir  75 mg Oral BID  . DISCONTD: piperacillin-tazobactam (ZOSYN)  IV  3.375 g Intravenous Q8H    Assessment/Plan:  Principal Problem:  *Metabolic encephalopathy Etiology as yet is unclear. Possibly solely do to high fevers which have peaked near 103F. Differential includes possibility of meningitis therefore will cover for bacterial (low likelihood) as well as for viral. Rule out other toxic issue so we'll check a urine drug screen. We'll broaden antibiotic coverage: Will DC Zosyn in favor of Rocephin and add ampicillin. We'll continue vancomycin. Will add acyclovir to cover for possible herpetic etiology. We'll check HSV PCR, RPR, folate, B12, and check for HIV. Will treat agitation symptomatically with medications. May need to exclude atypical seizure disorder-consider EEG. Initial CT of the head showed no acute process but this could be an atypical presentation of CVA therefore may need to eventually obtained MRI of the brain.  Active Problems:   Fever/Leukocytosis (leucocytosis) See above. WBCs 15,000 at presentation. Urinalysis and chest x-ray are negative. Followup on pending cultures. Lactic acid is normal and pro-cal is only borderline elevated therefore likelihood of sepsis his low.  Mild hypoxia Chest x-ray is negative except for some possible central edema which could be chronic. No evidence of aspiration on chest x-ray but current antibiotic treatment will cover for this.   Tachycardia This is a sinus etiology. Etiology likely multifactorial due to dehydration, fever, agitation. We'll continue to follow   Hyperosmolar nonketotic coma in diabetes/Hyponatremia CBG peak 555 at presentation with sodium 137. With initiation of empiric antibiotic coverage, resumption of insulin, and adequate hydration CBGs have remained much better controlled in the 150-160 range although most recent reading just after 12 PM is up to 239. We'll continue sliding scale insulin. We'll go ahead and order Lantus insulin but monitor her response closely since her home dose is 75 units twice a day which is quite a high dose. Concerned over possible hypoglycemia. Suspect patient either was not utilizing appropriately or was injecting into lower abdominal pannus which would affect appropriate absorption of this medication. May need to change insulin injection sites and once patient is alert she may need to be instructed in alternative sites as well. Usual metformin is on hold during acute  illness.  Mild Rhabdomyolysis/Dehydration CPK has been as high as 1931. Will continue to monitor renal function and provide IV fluid hydration. We'll repeat CPK in a.m. IV access is poor and further compromised by patient's agitation therefore we have asked for a PICC line to be inserted.  Disposition: She will remain in the step down unit due to altered mentation and increased risk for neuro/respiratory compromise.   LOS: 1 day   Junious Silk, ANP pager  [phone] 08/05/2011, 1:56 PM  The combination of the patient's morbid obesity and agitation have made her care quite difficult. I do not wish to sedate her heavily in order to obtain an MRI to rule out an acute stroke given that this would cloud the picture of her altered mental status. Likewise her encephalopathic symptoms are not consistent with a focal stroke. The possibility of meningitis must be entertained. Again I do not wish to heavily sedate her in the fashion that would be required to allow even an attempt at an LP. Given her size and LP would be quite difficult to obtain. At the point of our exam today she is RE been administered antibiotic and even if the results of her LP were unremarkable it wouldn't be clear if this was due to the antibiotic she started and given or a normal tap. As a result LP results would not allow me to discontinue antibiotics. I have chosen to treat the patient empirically at this time and we will continue to investigate the multiple potential etiologies for her encephalopathy.  I have personally examined this patient and reviewed the entire database. I have reviewed the above note, made any necessary editorial changes, and agree with its content.  Lonia Blood, MD [facility]

## 2011-08-05 NOTE — Progress Notes (Signed)
Pt restless,lethergic and confused at shift change,does not follow commands but answers to her name only,insulin drip infusing at 10.9units/hr,CBG rechecked at 2009,read 204,insulin rate reduced to 8.6 per glucose stabilizer protocol, v/s read T-101.6(orally) P110,R30 and BP 171/50, 95% on 2LNC,Pt still waiting to be transferred,CXR order changed to Portable by the Rapid Respond Nurse,same gotten at about 2045,Dr Doutova paged and notified about pt's temp,said will order tylenol suppository. CBG rechecked at 2109 read 192,insulin rate increased to 9.2 per protocol.Pt finally placed to be transferred to room 3302,report called to Nurse Nedra Hai in 3300 at 2120 and pt was taken down at 2140. [name], [name]

## 2011-08-05 NOTE — Progress Notes (Addendum)
Junious Silk, PA informed that pt is tachypneic and lethargic.  VS and O2 sats stable on 2LNC.  Pt responds to verbal stimuli but follows minimal commands and does not answer questions.    New orders received.

## 2011-08-05 NOTE — Progress Notes (Signed)
Inpatient Diabetes Program Recommendations  AACE/ADA: New Consensus Statement on Inpatient Glycemic Control (2009)  Target Ranges:  Prepandial:   less than 140 mg/dL      Peak postprandial:   less than 180 mg/dL (1-2 hours)      Critically ill patients:  140 - 180 mg/dL   Reason for Visit: Note patient admitted with hyperglycemia.  Currently she is NPO.  Inpatient Diabetes Program Recommendations Insulin - Basal: Consider 1/2 of home dose of basal insulin.  May consider Lantus 36 units bid. HgbA1C: Please order A1c to det. glycemic control over past 2-3 months.  Note:

## 2011-08-05 NOTE — Progress Notes (Signed)
Dr Sharon Seller in to assess.  Further orders received.  ABG and Xray done.

## 2011-08-05 NOTE — Progress Notes (Signed)
ANTIBIOTIC CONSULT NOTE - INITIAL  Pharmacy Consult for Acyclovir Indication: Possible Viral Encephalitis/Meningitis  Allergies  Allergen Reactions  . Amlodipine Other (See Comments)    unknown  . Azor (Amlodipine-Olmesartan) Swelling  . Thiazide-Type Diuretics Other (See Comments)    Renal issues    Patient Measurements: Height: 5\' 4"  (162.6 cm) Weight: 277 lb 5.4 oz (125.8 kg) IBW/kg (Calculated) : 54.7  Adjusted Body Weight:   Vital Signs: Temp: 99.2 F (37.3 C) (12/12 1556) Temp src: Oral (12/12 1600) BP: 166/77 mmHg (12/12 1556) Pulse Rate: 65  (12/12 1556) Intake/Output from previous day: 12/11 0701 - 12/12 0700 In: 1700 [I.V.:1700] Out: 1425 [Urine:1425] Intake/Output from this shift:    Labs:  Basename 08/05/11 0510 08/05/11 0025 08/04/11 1214 08/04/11 1140  WBC 15.1* -- -- 11.2*  HGB 12.3 -- 16.7* 13.8  PLT 321 -- -- 305  LABCREA -- 213.69 -- --  CREATININE -- -- 1.00 0.88   Estimated Creatinine Clearance: 81.4 ml/min (by C-G formula based on Cr of 1). No results found for this basename: VANCOTROUGH:2,VANCOPEAK:2,VANCORANDOM:2,GENTTROUGH:2,GENTPEAK:2,GENTRANDOM:2,TOBRATROUGH:2,TOBRAPEAK:2,TOBRARND:2,AMIKACINPEAK:2,AMIKACINTROU:2,AMIKACIN:2, in the last 72 hours   Microbiology: Recent Results (from the past 720 hour(s))  URINE CULTURE     Status: Normal   Collection Time   08/04/11 12:33 PM      Component Value Range Status Comment   Specimen Description URINE, CATHETERIZED   Final    Special Requests NONE   Final    Setup Time [id number]   Final    Colony Count NO GROWTH   Final    Culture NO GROWTH   Final    Report Status 08/05/2011 FINAL   Final   MRSA PCR SCREENING     Status: Normal   Collection Time   08/05/11 12:04 AM      Component Value Range Status Comment   MRSA by PCR NEGATIVE  NEGATIVE  Final     Medical History: Past Medical History  Diagnosis Date  . Diabetes mellitus   . Morbid obesity   . Obstructive airway disease   .  Hyperlipidemia   . Reactive airway disease     Assessment: 55yof to continue Acyclovir IV for possible viral meningitis/encephalitis. Pt is also receiving antibiotics to cover for bacterial meningitis. Due to pt's obesity (BMI 47.6), IBW (55kg) will be used as dosing weight. Renal function (CrCl 81 ml/min) is appropriate for q8h dosing.   Goal of Therapy:  Clinical improvement  Plan:  1. Acyclovir IV 550mg  (10 mg/kg) q8h 2. Continue to monitor clinical course, renal function and treatment plans  Cleon Dew [phone] 08/05/2011,7:45 PM

## 2011-08-05 NOTE — Progress Notes (Addendum)
The patient has unreliable peripheral IV access. There is concern that she could be suffering with a bacterial meningitis. As a result she is being treated with intravenous empiric antibiotics. In order to provide her with the appropriate care we must be able to provide these antibiotics in a reliable fashion. We have therefore requested a PICC line. The PICC line team has made multiple attempts to contact family members. Unfortunately no contact has been successfully made. If this patient has bacterial meningitis and is unable to receive her intravenous antibiotics it could very well be fatal. As a result I am requesting that the PICC team proceed with PICC placement on an emergent medical necessity basis.

## 2011-08-06 ENCOUNTER — Inpatient Hospital Stay (HOSPITAL_COMMUNITY): Payer: Medicaid Other

## 2011-08-06 ENCOUNTER — Encounter (HOSPITAL_COMMUNITY): Payer: Self-pay | Admitting: *Deleted

## 2011-08-06 LAB — GLUCOSE, CAPILLARY
Glucose-Capillary: 194 mg/dL — ABNORMAL HIGH (ref 70–99)
Glucose-Capillary: 88 mg/dL (ref 70–99)
Glucose-Capillary: 227 mg/dL — ABNORMAL HIGH (ref 70–99)

## 2011-08-06 LAB — DIFFERENTIAL
Basophils Relative: 0 % (ref 0–1)
Eosinophils Relative: 1 % (ref 0–5)
Lymphocytes Relative: 29 % (ref 12–46)
Monocytes Absolute: 1.1 10*3/uL — ABNORMAL HIGH (ref 0.1–1.0)
Monocytes Relative: 9 % (ref 3–12)
Neutro Abs: 7.9 10*3/uL — ABNORMAL HIGH (ref 1.7–7.7)

## 2011-08-06 LAB — CBC
HCT: 42.3 % (ref 36.0–46.0)
Hemoglobin: 13.1 g/dL (ref 12.0–15.0)
MCHC: 31 g/dL (ref 30.0–36.0)
MCV: 84.4 fL (ref 78.0–100.0)

## 2011-08-06 LAB — HIV ANTIBODY (ROUTINE TESTING W REFLEX): HIV: NONREACTIVE

## 2011-08-06 LAB — FOLATE RBC: RBC Folate: 747 ng/mL — ABNORMAL HIGH (ref >=366)

## 2011-08-06 LAB — URINE DRUGS OF ABUSE SCREEN W ALC, ROUTINE (REF LAB)
Amphetamine Screen, Ur: NEGATIVE
Marijuana Metabolite: NEGATIVE
Methadone: NEGATIVE
Propoxyphene: NEGATIVE

## 2011-08-06 LAB — HSV(HERPES SIMPLEX VRS) I + II AB-IGM: Herpes Simplex Vrs I&II-IgM Ab (EIA): 0.25 INDEX

## 2011-08-06 MED ORDER — SODIUM CHLORIDE 0.9 % IJ SOLN
10.0000 mL | INTRAMUSCULAR | Status: DC | PRN
  Administered 2011-08-09 (×2): 3 mL

## 2011-08-06 MED ORDER — ONDANSETRON HCL 4 MG/2ML IJ SOLN
INTRAMUSCULAR | Status: AC
  Administered 2011-08-06: 4 mg
  Filled 2011-08-06: qty 2

## 2011-08-06 MED ORDER — ONDANSETRON HCL 4 MG/2ML IJ SOLN: 4.0000 mg | Freq: Four times a day (QID) | INTRAMUSCULAR | Status: DC | PRN

## 2011-08-06 MED ORDER — SODIUM CHLORIDE 0.9 % IJ SOLN
10.0000 mL | Freq: Two times a day (BID) | INTRAMUSCULAR | Status: DC
  Administered 2011-08-06 – 2011-08-09 (×4): 10 mL
  Administered 2011-08-09: 3 mL
  Filled 2011-08-06 (×2): qty 10

## 2011-08-06 MED ORDER — INSULIN GLARGINE 100 UNIT/ML ~~LOC~~ SOLN
50.0000 [IU] | Freq: Two times a day (BID) | SUBCUTANEOUS | Status: DC
  Administered 2011-08-06 – 2011-08-07 (×2): 50 [IU] via SUBCUTANEOUS
  Filled 2011-08-06 (×3): qty 3

## 2011-08-06 MED ORDER — ACETAMINOPHEN 325 MG PO TABS
650.0000 mg | ORAL_TABLET | Freq: Four times a day (QID) | ORAL | Status: DC | PRN
  Administered 2011-08-06: 650 mg via ORAL
  Filled 2011-08-06: qty 2

## 2011-08-06 NOTE — Progress Notes (Signed)
Utilization Review Completed.Marsella Suman T12/13/2012   

## 2011-08-06 NOTE — Progress Notes (Addendum)
Subjective: Much more alert this morning. Able to slowly answer questions posed. Endorse minimal neck discomfort when asked. She was unclear as to symptoms that may have been present prior to presentation to the hospital. She denied current shortness of breath, chest pain, photophobia, or dizziness. She was able to tell us she lives alone.  Objective: Vital signs in last 24 hours: Temp:  [98.6 F (37 C)-99.4 F (37.4 C)] 98.6 F (37 C) (12/13 0417) Pulse Rate:  [65-87] 87  (12/13 0740) Resp:  [15-28] 25  (12/13 0740) BP: (129-189)/(56-91) 152/83 mmHg (12/13 0740) SpO2:  [96 %-99 %] 98 % (12/13 0740) Weight change:     Intake/Output from previous day: 12/12 0701 - 12/13 0700 In: 1172 [IV Piggyback:1172] Out: 850 [Urine:850] Intake/Output this shift:    General appearance: appears older than stated age, agitation has resolved, mildly anxious. Resp: clear to auscultation bilaterally, non-labored effort and mild tachypnea; 2 L nasal cannula oxygen, respiratory effort is nonlabored, no cough. Cardio: Sinus rhythm, no apparent JVD, S1-S2, normotensive, IV fluids at 100 cc per hour have been on hold due to lack of appropriate IV access. The IV team has just arrived to insert a PICC line. GI: Obese, soft, non-tender; bowel sounds normal; no masses,  no organomegaly Extremities: extremities normal, atraumatic, no cyanosis or edema Neurologic: Awake and alert today. Oriented to name, less likely to place time situation or year. Occasionally answered yes to all questions asked but when redirected became more direct with responses. She is moving all 4 extremities equally with strength being 4/5. No nuchal rigidity detected on exam. Speech is clear although her responses to questions asked are slow.  Lab Results:  [facility] 08/06/11 0430 08/05/11 0510  WBC 13.0* 15.1*  HGB 13.1 12.3  HCT 42.3 40.5  PLT 308 321   BMET  Basename 08/04/11 1214 08/04/11 1140  NA 137 134*  K 3.7 3.6  CL 96  92*  CO2 -- 24  GLUCOSE 555* 530*  BUN 10 10  CREATININE 1.00 0.88  CALCIUM -- 9.7    Studies/Results: Dg Chest Port 1 View  08/05/2011  *RADIOLOGY REPORT*  Clinical Data: Fever.  Hypoxia.  Shortness breath.  PORTABLE CHEST - 1 VIEW  Comparison: 08/04/2011.  Findings: Respiratory degraded portable examination and with chin overlying the lung apex.  Rotation to the right.  No gross pneumothorax.  Cardiomegaly.  Pulmonary vascular congestion most notable centrally.  Basilar increased markings greater on the right.  Question infiltrate versus atelectasis.  IMPRESSION: Limited portable examination with cardiomegaly and pulmonary vascular congestion most notable centrally.  Basilar atelectasis versus infiltrate greater on the right.  Original Report Authenticated By: Fuller Canada, M.D.   Dg Chest Port 1 View  08/04/2011  *RADIOLOGY REPORT*  Clinical Data: Shortness of breath.  PORTABLE CHEST - 1 VIEW  Comparison: 03/27/2011  Findings: Cardiomegaly.  Bilateral perihilar and lower lobe opacities, most likely edema.  Recommend clinical correlation to exclude infection.  No effusions.  No acute bony abnormality.  IMPRESSION: Bilateral perihilar and lower lobe opacities, most likely edema.  Original Report Authenticated By: Cyndie Chime, M.D.    Medications:  I have reviewed the patient's current medications. Scheduled:    . acyclovir  550 mg Intravenous Q8H  . ampicillin (OMNIPEN) IV  2 g Intravenous Q4H  . cefTRIAXone (ROCEPHIN)  IV  2 g Intravenous Q12H  . insulin aspart  0-20 Units Subcutaneous Q4H  . insulin glargine  75 Units Subcutaneous BID  . metoprolol  100 mg Oral Daily  . simvastatin  40 mg Oral QHS  . sodium chloride  10 mL Intracatheter Q12H  . vancomycin  1,250 mg Intravenous Q12H  . DISCONTD: acyclovir  600 mg Intravenous Q8H  . DISCONTD: cefTRIAXone (ROCEPHIN)  IV  2 g Intravenous Q24H    Assessment/Plan:  Principal Problem:  *Metabolic encephalopathy Etiology as  yet is unclear. Possibly solely do to high fevers which have peaked near 103F. Differential includes possibility of meningitis therefore will cover for bacterial (low likelihood) as well as for viral. Rule out other toxic issue so we'll check a urine drug screen. We'll broaden antibiotic coverage: Continue Rocephin,ampicillin and vancomycin. Pharmacy dosing acyclovir to cover for possible herpetic etiology.  HSV PCR is pending; RPR, folate, B12, and  HIV are normal. Will treat agitation symptomatically with medications. May need to exclude atypical seizure disorder-consider EEG. Initial CT of the head showed no acute process but this could be an atypical presentation of CVA therefore may need to eventually obtained MRI of the brain.  Active Problems:  Fever/Leukocytosis (leucocytosis)/probable acute left maxillary sinusitis See above. MAXIMUM TEMPERATURE past 12 hours 100.5. WBCs 13,000 today. Urinalysis and chest x-ray are negative although poor inspiratory effort seen on films. Because of this and no clear source of infection will need to repeat chest x-ray today. There was a question of haziness or possible infiltrate in the right lung we'll need to clarify. Followup on pending cultures. Lactic acid is normal and pro-calcitonin is only borderline elevated therefore likelihood of sepsis his low. CT of the head done at admission did reveal an incidental finding of probable acute left maxillary sinusitis. Given her fever and altered mentation acute sinusitis does not explain the symptoms. Either way current empiric therapy will cover this problem.  Mild hypoxia Chest x-ray is negative except for some possible central edema which could be chronic. No evidence of aspiration on chest x-ray but current antibiotic treatment will cover for this. See above regarding repeating chest x-ray today   Tachycardia Resolved. This is a sinus etiology. Etiology likely multifactorial due to dehydration, fever, agitation.  We'll continue to follow   Hyperosmolar nonketotic coma in diabetes/Hyponatremia CBG peak 555 at presentation with sodium 137. Most readings past 16 hours less than 200.  We'll continue sliding scale insulin. Do to relative hypoglycemia, CBGs today recently between 88 to 98, we will reduce home Lantus from 75 to 50 units twice a day to Usual metformin is on hold during acute illness.  Mild Rhabdomyolysis/Dehydration CPK remains high and greater than 1000 today is 1919. Despite this creatinine is stable. Will continue to monitor renal function and provide IV fluid hydration. Follow CPK serially. IV access is poor and further compromised by patient's agitation therefore we have asked for a PICC line to be inserted.  Disposition: She will remain in the step down unit due to altered mentation and increased risk for neuro/respiratory compromise.  The combination of the patient's morbid obesity and agitation have made her care quite difficult. I do not wish to sedate her heavily in order to obtain an MRI to rule out an acute stroke given that this would cloud the picture of her altered mental status. Likewise her encephalopathic symptoms are not consistent with a focal stroke. The possibility of meningitis must be entertained. Again I do not wish to heavily sedate her in the fashion that would be required to allow even an attempt at an LP. Given her size and LP would be quite difficult  to obtain. She has already received empiric antibiotic coverage and even if the results of her LP were unremarkable it wouldn't be clear if this was due to the antibiotics received or a normal tap. As a result LP results would not allow me to discontinue antibiotics. I have chosen to treat the patient empirically at this time and we will continue to investigate the multiple potential etiologies for her encephalopathy.  LOS: 2 days   Junious Silk, ANP pager [phone] 08/06/2011, 1:08 PM    I have examined the patient and  reviewed the chart. As mentioned above, the source of her fevers is uncertain. A CXR has been repeated and reveals improved aeration. No focal infiltrates are noted. She has been tachypneic but no cough is present. Abdomen is soft and nontender and I doubt that it is the source of her sepsis. We will continue acyclovir and antibiotics for now. It appears that her fever trend is improving.

## 2011-08-07 ENCOUNTER — Inpatient Hospital Stay (HOSPITAL_COMMUNITY): Payer: Medicaid Other

## 2011-08-07 LAB — GLUCOSE, CAPILLARY
Glucose-Capillary: 155 mg/dL — ABNORMAL HIGH (ref 70–99)
Glucose-Capillary: 172 mg/dL — ABNORMAL HIGH (ref 70–99)

## 2011-08-07 LAB — CBC
MCHC: 30.9 g/dL (ref 30.0–36.0)
RDW: 15 % (ref 11.5–15.5)

## 2011-08-07 LAB — DIFFERENTIAL
Basophils Absolute: 0 10*3/uL (ref 0.0–0.1)
Basophils Relative: 0 % (ref 0–1)
Neutro Abs: 7.7 10*3/uL (ref 1.7–7.7)
Neutrophils Relative %: 63 % (ref 43–77)

## 2011-08-07 LAB — BASIC METABOLIC PANEL
Chloride: 98 mEq/L (ref 96–112)
GFR calc non Af Amer: 64 mL/min — ABNORMAL LOW (ref >90)
Potassium: 2.9 mEq/L — ABNORMAL LOW (ref 3.5–5.1)

## 2011-08-07 MED ORDER — DEXTROSE 5 % IV SOLN
1.0000 g | Freq: Two times a day (BID) | INTRAVENOUS | Status: DC
  Administered 2011-08-07 – 2011-08-08 (×3): 1 g via INTRAVENOUS
  Filled 2011-08-07 (×4): qty 10

## 2011-08-07 MED ORDER — INSULIN GLARGINE 100 UNIT/ML ~~LOC~~ SOLN
60.0000 [IU] | Freq: Two times a day (BID) | SUBCUTANEOUS | Status: DC
  Administered 2011-08-07 – 2011-08-08 (×2): 60 [IU] via SUBCUTANEOUS
  Filled 2011-08-07: qty 3

## 2011-08-07 MED ORDER — INSULIN ASPART 100 UNIT/ML ~~LOC~~ SOLN
0.0000 [IU] | Freq: Three times a day (TID) | SUBCUTANEOUS | Status: DC
  Administered 2011-08-08: 4 [IU] via SUBCUTANEOUS
  Administered 2011-08-09: 11 [IU] via SUBCUTANEOUS
  Administered 2011-08-09: 3 [IU] via SUBCUTANEOUS
  Administered 2011-08-09: 4 [IU] via SUBCUTANEOUS
  Administered 2011-08-10 (×2): 3 [IU] via SUBCUTANEOUS
  Administered 2011-08-10: 4 [IU] via SUBCUTANEOUS
  Filled 2011-08-07: qty 3

## 2011-08-07 MED ORDER — LISINOPRIL 40 MG PO TABS
40.0000 mg | ORAL_TABLET | Freq: Every day | ORAL | Status: DC
  Administered 2011-08-07 – 2011-08-10 (×4): 40 mg via ORAL
  Filled 2011-08-07 (×4): qty 1

## 2011-08-07 MED ORDER — POTASSIUM CHLORIDE CRYS ER 20 MEQ PO TBCR
40.0000 meq | EXTENDED_RELEASE_TABLET | Freq: Once | ORAL | Status: AC
  Administered 2011-08-07: 40 meq via ORAL
  Filled 2011-08-07: qty 2

## 2011-08-07 MED ORDER — DEXTROSE 5 % IV SOLN
500.0000 mg | Freq: Every day | INTRAVENOUS | Status: DC
  Administered 2011-08-07 – 2011-08-08 (×2): 500 mg via INTRAVENOUS
  Filled 2011-08-07 (×2): qty 500

## 2011-08-07 NOTE — Progress Notes (Signed)
Subjective: Even more alert today. Conversational speech is also more appropriate. It is noted that the patient still has some difficulty with word finding. She was able to provide much improved preadmission health history. She denied headache photophobia or or significant neck pain that would lead one to believe she may have a meningeal issue prior to admission. She denied fevers prior to admission. She had a mild cough which was without shortness of breath. She also endorsed that she had run out of her home medications including her insulins. She has one niece and 2 nephews at the bedside who only recently found out about this patient's acute illness. They confirm that the patient's mental status is near to baseline but they agree she is still having difficulty expressing her thoughts.  Objective: Vital signs in last 24 hours: Temp:  [97.5 F (36.4 C)-98.5 F (36.9 C)] 98.5 F (36.9 C) (12/14 1200) Pulse Rate:  [59-88] 85  (12/14 1200) Resp:  [16-24] 18  (12/14 1200) BP: (149-173)/(54-90) 150/54 mmHg (12/14 1200) SpO2:  [93 %-100 %] 94 % (12/14 1200) Weight change:     Intake/Output from previous day: 12/13 0701 - 12/14 0700 In: 1952 [P.O.:880; IV Piggyback:1072] Out: 350 [Urine:350] Intake/Output this shift: Total I/O In: -  Out: 700 [Urine:700]  General appearance: appears older than stated age, agitation has resolved, anxious. Resp: clear to auscultation bilaterally, non-labored effort and mild tachypnea; 2 L nasal cannula oxygen, respiratory effort is nonlabored, no cough. Cardio: Sinus rhythm, no apparent JVD, S1-S2, normotensive GI: Obese, soft, non-tender; bowel sounds normal; no masses,  no organomegaly Extremities: extremities normal, atraumatic, no cyanosis or edema, tolerating solid diet area to Neurologic: Awake and alert today. Oriented x3 . She is moving all 4 extremities equally with strength being 4/5. No nuchal rigidity detected on exam. Speech is clear although her  responses to questions asked are slow and consistent with difficulty in word finding.  Lab Results:  Basename 08/07/11 0407 08/06/11 0430  WBC 12.3* 13.0*  HGB 12.7 13.1  HCT 41.1 42.3  PLT 267 308   BMET  Basename 08/07/11 0407  NA 138  K 2.9*  CL 98  CO2 32  GLUCOSE 117*  BUN 16  CREATININE 0.96  CALCIUM 8.7    Studies/Results: Mr Brain Wo Contrast  08/07/2011  *RADIOLOGY REPORT*  Clinical Data: Change in mental status the other day.  Better today.  MRI HEAD WITHOUT CONTRAST  Technique:  Multiplanar, multiecho pulse sequences of the brain and surrounding structures were obtained according to standard protocol without intravenous contrast.  Comparison: Fall 07/14/2011 CT.  No comparison MR.  Findings: No acute infarct.  No intracranial hemorrhage.  No intracranial mass lesion detected on this unenhanced exam.  Mild to moderate nonspecific white matter type changes most notable periventricular region.  In this diabetic patient, findings are most suggestive of result of small vessel disease.  No hydrocephalus.  Major intracranial vascular structures are patent.  Moderate paranasal sinus opacification/mucosal thickening with air fluid levels maxillary sinuses.  Exophthalmos.  Decreased signal intensity bone marrow.  This may be related to patient's habitus.  Correlation with CBC and differential to exclude anemia/infiltrative process contributing to this appearance recommended.  IMPRESSION: No acute infarct.  Mild to moderate small vessel disease type changes.  Prominent paranasal sinus opacification as detailed above.  Decreased signal intensity bone marrow.  This may be related to patient's habitus.  Correlation with CBC and differential to exclude anemia/infiltrative process contributing to this appearance recommended.  Original  Report Authenticated By: Fuller Canada, M.D.   Dg Chest Port 1 View  08/06/2011  *RADIOLOGY REPORT*  Clinical Data: Fever.  PORTABLE CHEST - 1 VIEW   Comparison: Chest 08/05/2011 and 08/04/2011.  Findings: Bibasilar airspace disease has improved over the past 2 days.  No pneumothorax or effusion.  Left PICC is noted.  IMPRESSION: Improving bibasilar airspace disease.  Original Report Authenticated By: Bernadene Bell. Maricela Curet, M.D.    Medications:  I have reviewed the patient's current medications. Scheduled:    . azithromycin  500 mg Intravenous Daily  . cefTRIAXone (ROCEPHIN)  IV  1 g Intravenous Q12H  . insulin aspart  0-20 Units Subcutaneous Q4H  . insulin glargine  50 Units Subcutaneous BID  . metoprolol  100 mg Oral Daily  . ondansetron      . simvastatin  40 mg Oral QHS  . sodium chloride  10 mL Intracatheter Q12H  . DISCONTD: acyclovir  550 mg Intravenous Q8H  . DISCONTD: ampicillin (OMNIPEN) IV  2 g Intravenous Q4H  . DISCONTD: cefTRIAXone (ROCEPHIN)  IV  2 g Intravenous Q12H  . DISCONTD: insulin glargine  75 Units Subcutaneous BID  . DISCONTD: vancomycin  1,250 mg Intravenous Q12H    Assessment/Plan:  Principal Problem:  *Metabolic encephalopathy Etiology as yet is unclear. Possibly solely due to high fevers which have peaked near 103F. History and current clinical workup is not consistent with meningitis etiology. HSV IgM negative. Possible etiology could be either related to abnormal serum glucose prior to admission or hypotension related to recent volume depletion at presentation. She does not have any focal neurological deficits except for difficulty in word finding but as a precaution we'll obtain a noncontrasted MRI of the brain to help clarify.   Fever/Leukocytosis (leucocytosis)/probable acute left maxillary sinusitis See above. MAXIMUM TEMPERATURE past 12 hours 100.5. WBCs 13,000 today. Urinalysis and chest x-ray are negative although poor inspiratory effort seen on films.  There was a question of haziness or possible infiltrate in the right lung which may very well represent a community acquired pneumonia.  Lactic  acid is normal and pro-calcitonin is only borderline elevated therefore likelihood of sepsis his low. CT of the head done at admission did reveal an incidental finding of probable acute left maxillary sinusitis. Given her fever and altered mentation acute sinusitis does not explain the symptoms. Either way current empiric therapy will cover this problem. We are planning on narrowing antibiotic therapy to cover for community-acquired pneumonia therefore we will discontinue the acyclovir ampicillin and vancomycin. Please see below  Mild hypoxia Initial Chest x-ray was negative except for some possible central edema which could be chronic. No evidence of aspiration on chest x-ray. Followup chest x-ray 08/06/2011 shows improvement in bibasilar atelectasis or infiltrates. At this point suspect the etiology of patient's fever and leukocytosis likely due to pulmonary illness with a possible bacterial etiology. We have narrowed antibiotics today to Rocephin 1 g with Zithromax 500 mg daily.  Tachycardia Resolved  Hyperosmolar nonketotic coma in diabetes/Hyponatremia CBG peak 555 at presentation with sodium 137. Most readings past 16 hours less than 200.  We'll continue sliding scale insulin. Do to relative hypoglycemia, CBGs today recently between 88 to 98, we will reduce home Lantus from 75 to 50 units twice a day to Usual metformin is on hold during acute illness. After discussing with the patient today it seems as if presentation with elevated serum glucose multifactorial due to inability to obtain medications as well as acute infection.  Mild Rhabdomyolysis/Dehydration CPK has been greater than 1900 at times this admission. Likely etiology felt to be due to dehydration prior to admission-we will go ahead ahead and repeat an additional CPK in the morning to see if the trend is downward.  Disposition: Pending results of the MRI of the brain today she may be an appropriate candidate to transfer out of the  step down by Saturday morning  Hypokalemia We will replete and follow   LOS: 3 days   Junious Silk, ANP pager [phone] 08/07/2011, 1:25 PM  I have personally examined this patient and reviewed the entire database. I have reviewed the above note, made any necessary editorial changes, and agree with its content.  Lonia Blood, MD [facility]

## 2011-08-07 NOTE — Plan of Care (Signed)
Problem: Phase II Progression Outcomes Goal: Tolerating diet Outcome: Progressing Clear liquid diet for dinner.  Episode of nausea and vomiting after eating.

## 2011-08-07 NOTE — Progress Notes (Signed)
Received call from Radiology. PICC line hanging from arm. Pt returned to floor. PICC line 52 cm intact. Tip intact as well. MD/PICC nurse notified. BC

## 2011-08-08 LAB — MAGNESIUM: Magnesium: 1.9 mg/dL (ref 1.5–2.5)

## 2011-08-08 LAB — BASIC METABOLIC PANEL
CO2: 31 mEq/L (ref 19–32)
Chloride: 100 mEq/L (ref 96–112)
Creatinine, Ser: 0.96 mg/dL (ref 0.50–1.10)
Glucose, Bld: 101 mg/dL — ABNORMAL HIGH (ref 70–99)
Sodium: 138 mEq/L (ref 135–145)

## 2011-08-08 LAB — GLUCOSE, CAPILLARY
Glucose-Capillary: 138 mg/dL — ABNORMAL HIGH (ref 70–99)
Glucose-Capillary: 192 mg/dL — ABNORMAL HIGH (ref 70–99)
Glucose-Capillary: 220 mg/dL — ABNORMAL HIGH (ref 70–99)

## 2011-08-08 LAB — CK: Total CK: 762 U/L — ABNORMAL HIGH (ref 7–177)

## 2011-08-08 MED ORDER — POTASSIUM CHLORIDE CRYS ER 20 MEQ PO TBCR
40.0000 meq | EXTENDED_RELEASE_TABLET | Freq: Two times a day (BID) | ORAL | Status: DC
  Administered 2011-08-08 – 2011-08-09 (×2): 40 meq via ORAL
  Filled 2011-08-08 (×3): qty 2

## 2011-08-08 MED ORDER — LEVOFLOXACIN 750 MG PO TABS
750.0000 mg | ORAL_TABLET | Freq: Every day | ORAL | Status: DC
  Administered 2011-08-09 – 2011-08-10 (×2): 750 mg via ORAL
  Filled 2011-08-08 (×3): qty 1

## 2011-08-08 MED ORDER — INSULIN GLARGINE 100 UNIT/ML ~~LOC~~ SOLN
65.0000 [IU] | Freq: Two times a day (BID) | SUBCUTANEOUS | Status: DC
  Administered 2011-08-08 – 2011-08-09 (×2): 65 [IU] via SUBCUTANEOUS
  Filled 2011-08-08: qty 3

## 2011-08-08 NOTE — Progress Notes (Signed)
Subjective: The patient appears to be back to her baseline mental status at the present time. She is alert and conversant and quite pleasant. She denies fevers chills nausea vomiting shortness of breath or chest pain.  Objective: Vital signs in last 24 hours: Blood pressure 148/71, pulse 77, temperature 98.6 F (37 C), temperature source Oral, resp. rate 29, height 5\' 4"  (1.626 m), weight 125.8 kg (277 lb 5.4 oz), SpO2 92.00%.   Intake/Output from previous day: 12/14 0701 - 12/15 0700 In: 300 [IV Piggyback:300] Out: 1875 [Urine:1875] Intake/Output this shift: Total I/O In: 10 [I.V.:10] Out: 1350 [Urine:1350]  General: No acute respiratory distress Lungs: Clear to auscultation bilaterally without wheezes or crackles Cardiovascular: Regular rate and rhythm without murmur gallop or rub normal S1 and S2 Abdomen: Nontender, nondistended, soft, bowel sounds positive, no rebound, no ascites, no appreciable mass Extremities: No significant cyanosis, clubbing, or edema bilateral lower extremities Neurologic: Alert and oriented x4, cranial nerves II through XII intact bilaterally, nonfocal exam  Lab Results:  [facility] 08/07/11 0407 08/06/11 0430  WBC 12.3* 13.0*  HGB 12.7 13.1  HCT 41.1 42.3  PLT 267 308   BMET  Basename 08/08/11 0500 08/07/11 0407  NA 138 138  K 3.2* 2.9*  CL 100 98  CO2 31 32  GLUCOSE 101* 117*  BUN 11 16  CREATININE 0.96 0.96  CALCIUM 8.4 8.7    Studies/Results: Mr Brain Wo Contrast  08/07/2011  *RADIOLOGY REPORT*  Clinical Data: Change in mental status the other day.  Better today.  MRI HEAD WITHOUT CONTRAST  Technique:  Multiplanar, multiecho pulse sequences of the brain and surrounding structures were obtained according to standard protocol without intravenous contrast.  Comparison: Fall 07/14/2011 CT.  No comparison MR.  Findings: No acute infarct.  No intracranial hemorrhage.  No intracranial mass lesion detected on this unenhanced exam.  Mild to  moderate nonspecific white matter type changes most notable periventricular region.  In this diabetic patient, findings are most suggestive of result of small vessel disease.  No hydrocephalus.  Major intracranial vascular structures are patent.  Moderate paranasal sinus opacification/mucosal thickening with air fluid levels maxillary sinuses.  Exophthalmos.  Decreased signal intensity bone marrow.  This may be related to patient's habitus.  Correlation with CBC and differential to exclude anemia/infiltrative process contributing to this appearance recommended.  IMPRESSION: No acute infarct.  Mild to moderate small vessel disease type changes.  Prominent paranasal sinus opacification as detailed above.  Decreased signal intensity bone marrow.  This may be related to patient's habitus.  Correlation with CBC and differential to exclude anemia/infiltrative process contributing to this appearance recommended.  Original Report Authenticated By: Fuller Canada, M.D.    Medications:  I have reviewed the patient's current medications. Scheduled:    . azithromycin  500 mg Intravenous Daily  . cefTRIAXone (ROCEPHIN)  IV  1 g Intravenous Q12H  . insulin aspart  0-20 Units Subcutaneous TID WC  . insulin glargine  60 Units Subcutaneous BID  . lisinopril  40 mg Oral Daily  . metoprolol  100 mg Oral Daily  . potassium chloride  40 mEq Oral Once  . simvastatin  40 mg Oral QHS  . sodium chloride  10 mL Intracatheter Q12H  . DISCONTD: insulin aspart  0-20 Units Subcutaneous Q4H  . DISCONTD: insulin glargine  50 Units Subcutaneous BID    Assessment/Plan:  Metabolic encephalopathy At this time the patient's encephalopathy appears to have cleared. Her evaluation has been negative with exception to her  pneumonia. I suspect this was a toxic metabolic encephalopathy related to her acute pulmonary infection, and further exacerbated by her severe hyperglycemia.  Bibasilar community-acquired pneumonia Clinically  much improved with empiric antibiotic therapy-transition to oral therapy and monitor for continued clinical stability  Tachycardia Resolved  Hyperosmolar nonketotic coma in diabetes CBGs are still somewhat erratic but are under reasonable control-I will very gently adjust her treatment plan and follow  Mild Rhabdomyolysis/Dehydration Resolved  Obesity  Hypokalemia Remains low-we'll continue to replace and follow  Disposition: Stable for transfer to a medical bed   LOS: 4 days  08/08/2011, 4:50 PM  Lonia Blood, MD [facility]

## 2011-08-08 NOTE — Progress Notes (Signed)
Physical Therapy Evaluation Patient Details Name: [name] MRN: [id number] DOB: 1956-03-19 Today's Date: 08/08/2011  Problem List:  Patient Active Problem List  Diagnoses  . Morbid obesity  . Reactive airway disease  . Obstructive airway disease  . Hyperlipidemia  . Metabolic encephalopathy  . Fever  . Leukocytosis (leucocytosis)  . Tachycardia - pulse  . Hyponatremia  . Hyperosmolar nonketotic coma in diabetes  . Acute respiratory failure with hypoxia  . Rhabdomyolysis  . Dehydration    Past Medical History:  Past Medical History  Diagnosis Date  . Diabetes mellitus   . Morbid obesity   . Obstructive airway disease   . Hyperlipidemia   . Reactive airway disease    Past Surgical History:  Past Surgical History  Procedure Date  . Abdominal hysterectomy   . Eye surgery     PT Assessment/Plan/Recommendation PT Assessment Clinical Impression Statement: Patient is a 55 yo female admitted with metabolic encephalopathy.  Patient now alert and oriented.  Required only supervision for mobility today.  Patient should be able to return home with intermittent family support.  Do not anticipate any follow-up PT needs PT Recommendation/Assessment: Patient will need skilled PT in the acute care venue PT Problem List: Decreased activity tolerance;Decreased balance;Decreased mobility;Decreased knowledge of use of DME;Cardiopulmonary status limiting activity PT Therapy Diagnosis : Difficulty walking PT Plan PT Frequency: Min 3X/week PT Treatment/Interventions: DME instruction;Gait training;Functional mobility training;Balance training;Patient/family education PT Recommendation Follow Up Recommendations: None Equipment Recommended: None recommended by PT PT Goals  Acute Rehab PT Goals PT Goal Formulation: With patient Time For Goal Achievement: 7 days Pt will go Supine/Side to Sit: Independently;with HOB 0 degrees PT Goal: Supine/Side to Sit - Progress: Not met Pt will go  Sit to Supine/Side: Independently;with HOB 0 degrees PT Goal: Sit to Supine/Side - Progress: Not met Pt will go Sit to Stand: with modified independence PT Goal: Sit to Stand - Progress: Not met Pt will go Stand to Sit: with modified independence PT Goal: Stand to Sit - Progress: Not met Pt will Ambulate: >150 feet;with modified independence;with least restrictive assistive device PT Goal: Ambulate - Progress: Not met  PT Evaluation Precautions/Restrictions  Precautions Precaution Comments: None Prior Functioning  Home Living Lives With: Alone Receives Help From: Family Type of Home: Apartment Home Layout: One level Home Access: Elevator Bathroom Shower/Tub: Tub/shower unit (Washes off at sink) Firefighter: Standard Home Adaptive Equipment: Environmental consultant - four wheeled;Straight cane Prior Function Level of Independence: Independent with basic ADLs;Independent with homemaking with ambulation;Independent with gait;Requires assistive device for independence Driving: No (Family assists patient with errands) Cognition Cognition Arousal/Alertness: Awake/alert Overall Cognitive Status: Appears within functional limits for tasks assessed Orientation Level: Oriented X4 Sensation/Coordination   Extremity Assessment RUE Assessment RUE Assessment: Within Functional Limits LUE Assessment LUE Assessment: Within Functional Limits RLE Assessment RLE Assessment: Within Functional Limits LLE Assessment LLE Assessment: Within Functional Limits Mobility (including Balance) Bed Mobility Bed Mobility: Yes Supine to Sit: 5: Supervision;HOB flat Supine to Sit Details (indicate cue type and reason): Supervision for safety Sitting - Scoot to Edge of Bed: 7: Independent Transfers Transfers: Yes Sit to Stand: 5: Supervision;With upper extremity assist;From bed Sit to Stand Details (indicate cue type and reason): Cues for safety and proper hand placement. Stand to Sit: 5: Supervision;With upper  extremity assist;With armrests;To chair/3-in-1 Stand to Sit Details: Cues for hand placement and safety. Ambulation/Gait Ambulation/Gait: Yes Ambulation/Gait Assistance: 5: Supervision Ambulation/Gait Assistance Details (indicate cue type and reason): Cues to move at slower pace  to minimize dyspnea Ambulation Distance (Feet): 72 Feet Assistive device: Rolling walker Gait Pattern: Step-through pattern;Trunk flexed Stairs: No  Posture/Postural Control Posture/Postural Control: No significant limitations Exercise    End of Session PT - End of Session Equipment Utilized During Treatment: Gait belt Activity Tolerance: Patient tolerated treatment well;Treatment limited secondary to medical complications (Comment) (O2 sat dropped to 88% on room air.  O2 reapplied) Patient left: in chair;with call bell in reach Nurse Communication: Mobility status for ambulation;Mobility status for transfers General Behavior During Session: [facility] for tasks performed Cognition: [facility] for tasks performed  Vena Austria [phone] 08/08/2011, 2:40 PM

## 2011-08-09 LAB — BASIC METABOLIC PANEL
BUN: 11 mg/dL (ref 6–23)
Calcium: 8.8 mg/dL (ref 8.4–10.5)
GFR calc Af Amer: 82 mL/min — ABNORMAL LOW (ref >90)
GFR calc non Af Amer: 71 mL/min — ABNORMAL LOW (ref >90)
Glucose, Bld: 144 mg/dL — ABNORMAL HIGH (ref 70–99)
Sodium: 141 mEq/L (ref 135–145)

## 2011-08-09 LAB — GLUCOSE, CAPILLARY
Glucose-Capillary: 190 mg/dL — ABNORMAL HIGH (ref 70–99)
Glucose-Capillary: 236 mg/dL — ABNORMAL HIGH (ref 70–99)
Glucose-Capillary: 211 mg/dL — ABNORMAL HIGH (ref 70–99)

## 2011-08-09 LAB — CBC
MCH: 25.3 pg — ABNORMAL LOW (ref 26.0–34.0)
MCHC: 29.1 g/dL — ABNORMAL LOW (ref 30.0–36.0)
Platelets: 230 10*3/uL (ref 150–400)
RBC: 4.78 MIL/uL (ref 3.87–5.11)

## 2011-08-09 MED ORDER — INSULIN GLARGINE 100 UNIT/ML ~~LOC~~ SOLN
70.0000 [IU] | Freq: Two times a day (BID) | SUBCUTANEOUS | Status: DC
  Administered 2011-08-09 – 2011-08-10 (×2): 70 [IU] via SUBCUTANEOUS

## 2011-08-09 MED ORDER — HYDRALAZINE HCL 10 MG PO TABS
10.0000 mg | ORAL_TABLET | Freq: Three times a day (TID) | ORAL | Status: DC
  Administered 2011-08-09 – 2011-08-10 (×4): 10 mg via ORAL
  Filled 2011-08-09 (×6): qty 1

## 2011-08-09 MED ORDER — POTASSIUM CHLORIDE CRYS ER 20 MEQ PO TBCR
20.0000 meq | EXTENDED_RELEASE_TABLET | Freq: Two times a day (BID) | ORAL | Status: DC
  Administered 2011-08-09 – 2011-08-10 (×2): 20 meq via ORAL
  Filled 2011-08-09 (×3): qty 1

## 2011-08-09 NOTE — Progress Notes (Signed)
Subjective: The patient appears to be back to her baseline mental status at the present time. She is alert and conversant and quite pleasant. She denies fevers chills nausea vomiting shortness of breath or chest pain the time of my exam.  Objective: Vital signs in last 24 hours: Blood pressure 186/82, pulse 62, temperature 98.6 F (37 C), temperature source Oral, resp. rate 18, height 5\' 4"  (1.626 m), weight 129.1 kg (284 lb 9.8 oz), SpO2 95.00%.Last BM Date:  (week ago pt does not remember LBM)  Intake/Output from previous day: 12/15 0701 - 12/16 0700 In: 160 [P.O.:150; I.V.:10] Out: 1352 [Urine:1352]    General: No acute respiratory distress Lungs: Clear to auscultation bilaterally without wheezes or crackles Cardiovascular: Regular rate and rhythm without murmur gallop or rub normal S1 and S2 Abdomen: Nontender/nondistended, soft, bowel sounds positive, no rebound, no ascites, no appreciable mass, obese Extremities: No significant cyanosis, clubbing, or edema bilateral lower extremities Neurologic: Alert and oriented x4, cranial nerves II through XII intact bilaterally, nonfocal exam  Lab Results:  [facility] 08/09/11 0605 08/07/11 0407  WBC 9.0 12.3*  HGB 12.1 12.7  HCT 41.6 41.1  PLT 230 267   BMET  Basename 08/09/11 0605 08/08/11 0500  NA 141 138  K 3.9 3.2*  CL 101 100  CO2 29 31  GLUCOSE 144* 101*  BUN 11 11  CREATININE 0.89 0.96  CALCIUM 8.8 8.4    Studies/Results: No results found.  Medications:  I have reviewed the patient's current medications.  Assessment/Plan:  Metabolic encephalopathy At this time the patient's encephalopathy appears to have cleared. Her evaluation has been negative with exception to her pneumonia. I suspect this was a toxic metabolic encephalopathy related to her acute pulmonary infection, and further exacerbated by her severe hyperglycemia.  Bibasilar community-acquired pneumonia Clinically much improved with empiric antibiotic  therapy-remains stable on oral antibiotic therapy  Tachycardia Resolved  Hyperosmolar nonketotic coma in diabetes CBGs are not yet at our goal-I will make further adjustments in her treatment regimen today  Mild Rhabdomyolysis/Dehydration Resolved  Obesity  Hypokalemia Resolved  Disposition: Monitor overnight with plan for discharge home in a.m. if remains stable   LOS: 5 days  08/09/2011, 3:27 PM  Lonia Blood, MD [facility] Office  [phone] Pager [phone]  On-Call/Text Page:      [email or web address]      password [facility]

## 2011-08-10 LAB — BASIC METABOLIC PANEL
CO2: 31 mEq/L (ref 19–32)
Chloride: 102 mEq/L (ref 96–112)
Sodium: 143 mEq/L (ref 135–145)

## 2011-08-10 LAB — GLUCOSE, CAPILLARY
Glucose-Capillary: 127 mg/dL — ABNORMAL HIGH (ref 70–99)
Glucose-Capillary: 134 mg/dL — ABNORMAL HIGH (ref 70–99)

## 2011-08-10 MED ORDER — LEVOFLOXACIN 750 MG PO TABS: 750.0000 mg | ORAL_TABLET | Freq: Every day | ORAL | Status: AC

## 2011-08-10 MED ORDER — INSULIN LISPRO 100 UNIT/ML ~~LOC~~ SOLN: 4.0000 [IU] | Freq: Three times a day (TID) | SUBCUTANEOUS | Status: DC

## 2011-08-10 MED ORDER — INSULIN GLARGINE 100 UNIT/ML ~~LOC~~ SOLN: 75.0000 [IU] | Freq: Two times a day (BID) | SUBCUTANEOUS | Status: DC

## 2011-08-10 MED ORDER — ALBUTEROL SULFATE HFA 108 (90 BASE) MCG/ACT IN AERS: 1.0000 | INHALATION_SPRAY | RESPIRATORY_TRACT | Status: AC | PRN

## 2011-08-10 MED ORDER — QUINAPRIL HCL 40 MG PO TABS: 40.0000 mg | ORAL_TABLET | Freq: Every day | ORAL | Status: DC

## 2011-08-10 MED ORDER — SIMVASTATIN 40 MG PO TABS: 40.0000 mg | ORAL_TABLET | Freq: Every day | ORAL | Status: DC

## 2011-08-10 MED ORDER — METOPROLOL TARTRATE 100 MG PO TABS: 100.0000 mg | ORAL_TABLET | Freq: Every day | ORAL | Status: DC

## 2011-08-10 MED ORDER — HYDRALAZINE HCL 10 MG PO TABS: 10.0000 mg | ORAL_TABLET | Freq: Three times a day (TID) | ORAL | Status: DC

## 2011-08-10 NOTE — Progress Notes (Signed)
Pt was given the [facility] number to assist with getting a new PCP. [name] 08/10/2011 [phone] OR [phone]

## 2011-08-10 NOTE — Discharge Summary (Signed)
DISCHARGE SUMMARY  [name]  MR#: [id number]  DOB:1956/01/16  Date of Admission: 08/04/2011 Date of Discharge: 08/10/2011  Attending Physician:Jeffrey T Sharon Seller  Patient's PCP: Needs to establish  Consults:  None  Discharge Diagnoses: Principal Problem:  *Metabolic encephalopathy Active Problems:  Fever  Leukocytosis (leucocytosis)  Acute respiratory failure with hypoxia  Tachycardia - pulse  Hyponatremia  Hyperosmolar nonketotic coma in diabetes  Rhabdomyolysis  Dehydration   Radiology: Ct Head Wo Contrast  08/04/2011  *RADIOLOGY REPORT*  Clinical Data:  Altered mental status.  Found sitting in a chair, mumbling.  Left pupil non reactive.  Right arm weakness.  CT HEAD WITHOUT CONTRAST  Technique:  Contiguous axial images were obtained from the base of the skull through the vertex without contrast.  Comparison: 08/17/2005  Findings: There is mild central and cortical atrophy. Periventricular white matter changes are present.  No intra or extra-axial fluid collection or mass.  Basilar cisterns and ventricles have a normal appearance.  Bone windows show air fluid level within the left maxillary sinus. There is mucosal thickening of the ethmoid air cells.  No calvarial fracture.  IMPRESSION: 1.  Mild atrophy and small vessel disease. 2. No evidence for acute intracranial abnormality. 3.  Chronic sinusitis. 4.  Suspect left maxillary acute sinusitis.  Original Report Authenticated By: Patterson Hammersmith, M.D.   Mr Brain Wo Contrast  08/07/2011  *RADIOLOGY REPORT*  Clinical Data: Change in mental status the other day.  Better today.  MRI HEAD WITHOUT CONTRAST  Technique:  Multiplanar, multiecho pulse sequences of the brain and surrounding structures were obtained according to standard protocol without intravenous contrast.  Comparison: Fall 07/14/2011 CT.  No comparison MR.  Findings: No acute infarct.  No intracranial hemorrhage.  No intracranial mass lesion detected on this  unenhanced exam.  Mild to moderate nonspecific white matter type changes most notable periventricular region.  In this diabetic patient, findings are most suggestive of result of small vessel disease.  No hydrocephalus.  Major intracranial vascular structures are patent.  Moderate paranasal sinus opacification/mucosal thickening with air fluid levels maxillary sinuses.  Exophthalmos.  Decreased signal intensity bone marrow.  This may be related to patient's habitus.  Correlation with CBC and differential to exclude anemia/infiltrative process contributing to this appearance recommended.  IMPRESSION: No acute infarct.  Mild to moderate small vessel disease type changes.  Prominent paranasal sinus opacification as detailed above.  Decreased signal intensity bone marrow.  This may be related to patient's habitus.  Correlation with CBC and differential to exclude anemia/infiltrative process contributing to this appearance recommended.  Original Report Authenticated By: Fuller Canada, M.D.   Dg Chest Port 1 View  08/06/2011  *RADIOLOGY REPORT*  Clinical Data: Fever.  PORTABLE CHEST - 1 VIEW  Comparison: Chest 08/05/2011 and 08/04/2011.  Findings: Bibasilar airspace disease has improved over the past 2 days.  No pneumothorax or effusion.  Left PICC is noted.  IMPRESSION: Improving bibasilar airspace disease.  Original Report Authenticated By: Bernadene Bell. Maricela Curet, M.D.   Dg Chest Port 1 View  08/05/2011  *RADIOLOGY REPORT*  Clinical Data: Fever.  Hypoxia.  Shortness breath.  PORTABLE CHEST - 1 VIEW  Comparison: 08/04/2011.  Findings: Respiratory degraded portable examination and with chin overlying the lung apex.  Rotation to the right.  No gross pneumothorax.  Cardiomegaly.  Pulmonary vascular congestion most notable centrally.  Basilar increased markings greater on the right.  Question infiltrate versus atelectasis.  IMPRESSION: Limited portable examination with cardiomegaly and pulmonary vascular congestion  most notable centrally.  Basilar atelectasis versus infiltrate greater on the right.  Original Report Authenticated By: Fuller Canada, M.D.   Dg Chest Port 1 View  08/04/2011  *RADIOLOGY REPORT*  Clinical Data: Shortness of breath.  PORTABLE CHEST - 1 VIEW  Comparison: 03/27/2011  Findings: Cardiomegaly.  Bilateral perihilar and lower lobe opacities, most likely edema.  Recommend clinical correlation to exclude infection.  No effusions.  No acute bony abnormality.  IMPRESSION: Bilateral perihilar and lower lobe opacities, most likely edema.  Original Report Authenticated By: Cyndie Chime, M.D.    Laboratory: Results for orders placed during the hospital encounter of 08/04/11 (from the past 48 hour(s))  GLUCOSE, CAPILLARY     Status: Abnormal   Collection Time   08/08/11 12:36 PM      Component Value Range Comment   Glucose-Capillary 192 (*) 70 - 99 (mg/dL)    Comment 1 Notify RN     GLUCOSE, CAPILLARY     Status: Abnormal   Collection Time   08/08/11  5:55 PM      Component Value Range Comment   Glucose-Capillary 220 (*) 70 - 99 (mg/dL)    Comment 1 Notify RN     GLUCOSE, CAPILLARY     Status: Abnormal   Collection Time   08/08/11  9:44 PM      Component Value Range Comment   Glucose-Capillary 304 (*) 70 - 99 (mg/dL)    Comment 1 Documented in Chart      Comment 2 Notify RN     GLUCOSE, CAPILLARY     Status: Abnormal   Collection Time   08/09/11  5:24 AM      Component Value Range Comment   Glucose-Capillary 172 (*) 70 - 99 (mg/dL)   BASIC METABOLIC PANEL     Status: Abnormal   Collection Time   08/09/11  6:05 AM      Component Value Range Comment   Sodium 141  135 - 145 (mEq/L)    Potassium 3.9  3.5 - 5.1 (mEq/L)    Chloride 101  96 - 112 (mEq/L)    CO2 29  19 - 32 (mEq/L)    Glucose, Bld 144 (*) 70 - 99 (mg/dL)    BUN 11  6 - 23 (mg/dL)    Creatinine, Ser 0.45  0.50 - 1.10 (mg/dL)    Calcium 8.8  8.4 - 10.5 (mg/dL)    GFR calc non Af Amer 71 (*) >90 (mL/min)    GFR  calc Af Amer 82 (*) >90 (mL/min)   CBC     Status: Abnormal   Collection Time   08/09/11  6:05 AM      Component Value Range Comment   WBC 9.0  4.0 - 10.5 (K/uL)    RBC 4.78  3.87 - 5.11 (MIL/uL)    Hemoglobin 12.1  12.0 - 15.0 (g/dL)    HCT 40.9  81.1 - 91.4 (%)    MCV 87.0  78.0 - 100.0 (fL)    MCH 25.3 (*) 26.0 - 34.0 (pg)    MCHC 29.1 (*) 30.0 - 36.0 (g/dL)    RDW 78.2  95.6 - 21.3 (%)    Platelets 230  150 - 400 (K/uL)   GLUCOSE, CAPILLARY     Status: Abnormal   Collection Time   08/09/11  6:34 AM      Component Value Range Comment   Glucose-Capillary 138 (*) 70 - 99 (mg/dL)   GLUCOSE, CAPILLARY  Status: Abnormal   Collection Time   08/09/11 12:06 PM      Component Value Range Comment   Glucose-Capillary 190 (*) 70 - 99 (mg/dL)   GLUCOSE, CAPILLARY     Status: Abnormal   Collection Time   08/09/11  4:32 PM      Component Value Range Comment   Glucose-Capillary 236 (*) 70 - 99 (mg/dL)    Comment 1 Notify RN      Comment 2 Documented in Chart     GLUCOSE, CAPILLARY     Status: Abnormal   Collection Time   08/09/11  9:49 PM      Component Value Range Comment   Glucose-Capillary 211 (*) 70 - 99 (mg/dL)   BASIC METABOLIC PANEL     Status: Abnormal   Collection Time   08/10/11  6:00 AM      Component Value Range Comment   Sodium 143  135 - 145 (mEq/L)    Potassium 3.7  3.5 - 5.1 (mEq/L)    Chloride 102  96 - 112 (mEq/L)    CO2 31  19 - 32 (mEq/L)    Glucose, Bld 94  70 - 99 (mg/dL)    BUN 13  6 - 23 (mg/dL)    Creatinine, Ser 1.61  0.50 - 1.10 (mg/dL)    Calcium 9.6  8.4 - 10.5 (mg/dL)    GFR calc non Af Amer 67 (*) >90 (mL/min)    GFR calc Af Amer 78 (*) >90 (mL/min)   GLUCOSE, CAPILLARY     Status: Abnormal   Collection Time   08/10/11  6:35 AM      Component Value Range Comment   Glucose-Capillary 127 (*) 70 - 99 (mg/dL)      Current Discharge Medication List    START taking these medications   Details  hydrALAZINE (APRESOLINE) 10 MG tablet Take 1  tablet (10 mg total) by mouth every 8 (eight) hours. Qty: 30 tablet, Refills: 1    insulin lispro (HUMALOG) 100 UNIT/ML injection Inject 4 Units into the skin 3 (three) times daily before meals. Qty: 10 mL, Refills: 12    levofloxacin (LEVAQUIN) 750 MG tablet Take 1 tablet (750 mg total) by mouth daily at 6 PM. Qty: 10 tablet, Refills: 0      CONTINUE these medications which have NOT CHANGED   Details  albuterol (PROVENTIL HFA;VENTOLIN HFA) 108 (90 BASE) MCG/ACT inhaler Inhale 1 puff into the lungs every 4 (four) hours as needed. For shortness of breath.     insulin glargine (LANTUS) 100 UNIT/ML injection Inject 75 Units into the skin 2 (two) times daily.      metoprolol (LOPRESSOR) 100 MG tablet Take 100 mg by mouth daily.      mometasone (NASONEX) 50 MCG/ACT nasal spray Place 2 sprays into the nose at bedtime.      quinapril (ACCUPRIL) 40 MG tablet Take 40 mg by mouth daily.      simvastatin (ZOCOR) 40 MG tablet Take 40 mg by mouth at bedtime.        STOP taking these medications     metFORMIN (GLUCOPHAGE) 500 MG tablet        History of present illness: A 55 year old female presented to the ER for altered mentation she was found by a neighbor sitting in a chair and was unable to talk. At admission it was unknown the duration of the symptoms. He was transported to the hospital via EMS and at that time was able to  answer yes and no questions but was unable to follow commands. Addition evaluated her she was unable to give any history and is mainly mumbling responses. Clinical exam she was febrile with a temperature of 102.7 BP was 179/62 and pulse was 113 her respirations were 49 and she was maintaining O2 saturations of 95%. A thorough neurological exam was unable to be completed because of the patient's inability to participate. She was spontaneously moving all extremities without difficulty. Did not appear to have any nuchal rigidity on exam. She would open her eyes to tactile  stimulation. Her glucose was elevated at 555. WBC count was only mildly elevated at 11,200. CPK total was 357. A CT scan of the head without contrast showed no acute abnormality only incidental finding of a possible left maxillary acute sinusitis.   Hospital Course: Principal Problem:  *Metabolic encephalopathy She presented with acute mental status of uncertain etiology. She'll CT scan of the head showed no evidence of acute stroke. Subsequent MRI of the brain again showed no evidence of acute neurologic injury. Did present with serum glucose greater than 500 as well as profound dehydration and fever it is likely that all of these metabolic issues contributed to her altered mentation. 08/06/2011 she was much more alert and a proper neurological exam couldn't be completed. Except for minimal word finding issues her neurological functions were intact and she had no evidence of nuchal rigidity or meningeal signs on clinical exam. By date of discharge her neurological status had returned to baseline.  Active Problems:  Fever/Leukocytosis (leucocytosis) She presented with fever greater than 102 as well as a white count of around 11,000 that peaked at 15,000 prior to initiation of antibiotics. Cause of her presentation with altered mentation there was concern that she may have meningitis process. Due to significant agitation as well as body habitus of an obese person lumbar puncture procedure was declined. Felt that this procedure would offer more risk than benefit. She was empirically treated with broad-spectrum antibiotics including high-dose Rocephin, ampicillin, vancomycin as well as acyclovir to cover for for possible herpetic encephalopathy. Currently with correction of metabolic derangements as described above as well as treatment of underlying infectious process which was likely felt to be bilateral pneumonia her fever and leukocytosis have resolved. Again once the patient was awake her clinical exam  was not consistent with meningitis process.   Acute respiratory failure with hypoxia/bibasilar community-acquired pneumonia After admission and adequate hydration patient developed hypoxemic respiratory failure. With followup chest x-ray 24 hours after admission demonstrating basilar atelectasis versus infiltrate greater on the right. Initial chest x-ray demonstrated bilateral perihilar and lower lobe opacities. Due to presentation with altered mentation there was concern that she may have aspirated. She was already on broad spectrum antibiotic coverage as mentioned above. Most recent followup chest x-ray on 08/06/2011 demonstrated improving bibasilar airspace disease. She will complete a 14 day course of Levaquin after discharge.   Tachycardia - pulse Multifactorial problem related to high fever, dehydration and agitation at initial presentation. This problem has resolved.   Hyperosmolar nonketotic coma in diabetes She presented with serum glucose greater than 500. This was rapidly corrected with IV insulin. She was later admission back over to her usual home dose of Lantus. Because of acute illness her metformin was held during the hospitalization. I did discuss with Dr. Sharon Seller and given her risk of recurrent hyperosmolar hyperglycemia in the future with resulting dehydration in a patient on ACE inhibitor therapy it is best to not continue the  metformin after discharge to avoid risk for lactic acidosis. In addition we did discuss with this patient and she normally utilizes Humalog insulin for meal coverage at home and we will resume this after discharge. Dr. Sharon Seller also encouraged her to check her serum glucose twice daily and record and also to check them at varying times between arising and going to bed this will help clarify any patterns regarding her glucose levels.   Rhabdomyolysis/Dehydration This was multifactorial due to fever dehydration and hyperosmolar state at presentation. Also according  to her history prior to admission she had been found sitting in a chair for an unknown period of time. There was no evidence of skin breakdown or deep tissue injury seen on the scan but obviously this type of disability certainly would contribute to her rhabdomyolysis situation. With adequate IV fluid rehydration this problem has resolved.  Hypertension She was on Accupril and Lopressor prior to admission. We have also added low-dose hydralazine this admission.  Day of Discharge BP 173/76  Pulse 82  Temp(Src) 97.2 F (36.2 C) (Oral)  Resp 18  Ht 5\' 4"  (1.626 m)  Wt 129.1 kg (284 lb 9.8 oz)  BMI 48.85 kg/m2  SpO2 98%  Physical Exam:  General appearance: alert, cooperative, appears stated age and no distress Resp: clear to auscultation bilaterally Cardio: regular rate and rhythm, S1, S2 normal, no murmur, click, rub or gallop GI: soft, non-tender; bowel sounds normal; no masses,  no organomegaly Extremities: extremities normal, atraumatic, no cyanosis or edema Neurologic: Grossly normal  Follow-up: To be arranged with assist from SW/CM prior to d/c home.  Disposition: D/C home   Junious Silk, ANP pager [phone]  I have personally examined this patient and reviewed the entire database. I have reviewed the above note, made any necessary editorial changes, and agree with its content.  Lonia Blood, MD [facility]

## 2011-08-10 NOTE — Progress Notes (Signed)
Occupational Therapy Evaluation Patient Details Name: [name] MRN: [id number] DOB: 08/15/1956 Today's Date: 08/10/2011 8:59-9:20  Problem List:  Patient Active Problem List  Diagnoses  . Morbid obesity  . Reactive airway disease  . Obstructive airway disease  . Hyperlipidemia  . Metabolic encephalopathy  . Fever  . Leukocytosis (leucocytosis)  . Tachycardia - pulse  . Hyponatremia  . Hyperosmolar nonketotic coma in diabetes  . Acute respiratory failure with hypoxia  . Rhabdomyolysis  . Dehydration    Past Medical History:  Past Medical History  Diagnosis Date  . Diabetes mellitus   . Morbid obesity   . Obstructive airway disease   . Hyperlipidemia   . Reactive airway disease    Past Surgical History:  Past Surgical History  Procedure Date  . Abdominal hysterectomy   . Eye surgery     OT Assessment/Plan/Recommendation OT Assessment Clinical Impression Statement: This 55 yo female admitted for encephalopathy presents to acute OT at an I level. No further OT needs, will sign off. OT Recommendation/Assessment: Patient does not need any further OT services OT Recommendation Equipment Recommended: None recommended by OT OT Goals    OT Evaluation Precautions/Restrictions  Precautions Precaution Comments: None Required Braces or Orthoses: No Restrictions Weight Bearing Restrictions: No Prior Functioning Home Living Lives With: Alone Receives Help From: Family Type of Home: Apartment Home Layout: One level Home Access: Engineer, maintenance (IT) Shower/Tub:  (takes sponge baths at sink) Firefighter: Standard Home Adaptive Equipment: Dan Humphreys - four wheeled;Straight cane Prior Function Level of Independence: Independent with basic ADLs;Independent with homemaking with ambulation;Requires assistive device for independence Driving: No ADL ADL Eating/Feeding: Simulated;Independent Where Assessed - Eating/Feeding: Edge of bed Grooming:  Performed;Independent Where Assessed - Grooming: Standing at sink Upper Body Bathing: Simulated;Independent Where Assessed - Upper Body Bathing: Sitting, bed;Sitting, chair Lower Body Bathing: Simulated;Modified independent Where Assessed - Lower Body Bathing: Other (comment) (turned sideways on the bed as she does at home) Upper Body Dressing: Simulated;Independent Where Assessed - Upper Body Dressing: Sitting, bed;Sitting, chair Lower Body Dressing: Simulated;Modified independent Where Assessed - Lower Body Dressing: Sitting, chair;Sitting, bed (turning sideways on the bed as she does at home) Toilet Transfer: Performed;Independent Toilet Transfer Method: Proofreader: Regular height toilet;Grab bars Toileting - Clothing Manipulation: Performed;Independent Where Assessed - Toileting Clothing Manipulation: Standing Toileting - Hygiene: Performed;Independent Where Assessed - Toileting Hygiene: Sit on 3-in-1 or toilet Tub/Shower Transfer: Not assessed;Other (comment) (sponge bathes) Tub/Shower Transfer Method: Not assessed Equipment Used: Other (comment) (none) Vision/Perception  Vision - History Baseline Vision:  ("I need to go back to the eye doctor, have not been in 3 yrs) Patient Visual Report: No change from baseline Cognition Cognition Arousal/Alertness: Awake/alert Overall Cognitive Status: Appears within functional limits for tasks assessed Orientation Level: Oriented X4 Sensation/Coordination   Extremity Assessment RUE Assessment RUE Assessment: Within Functional Limits LUE Assessment LUE Assessment: Within Functional Limits Mobility  Bed Mobility Bed Mobility: Yes Supine to Sit: 7: Independent;HOB flat Sitting - Scoot to Edge of Bed: 7: Independent Transfers Transfers: Yes Sit to Stand: 7: Independent Stand to Sit: 7: Independent Exercises   End of Session OT - End of Session Equipment Utilized During Treatment: Other (comment)  (none) Activity Tolerance: Other (comment) (had to rest 3 times going around circle) Patient left: in chair;with call bell in reach;Other (comment) (phone within reach) Nurse Communication: Mobility status for transfers;Mobility status for ambulation General Behavior During Session: [facility] for tasks performed Cognition: [facility] for tasks performed   Evette Georges [phone]  08/10/2011, 12:31 PM

## 2011-08-11 LAB — CULTURE, BLOOD (ROUTINE X 2): Culture  Setup Time: 201212120403

## 2012-04-28 ENCOUNTER — Encounter (HOSPITAL_COMMUNITY): Payer: Self-pay | Admitting: *Deleted

## 2012-04-28 ENCOUNTER — Emergency Department (HOSPITAL_COMMUNITY)
Admission: EM | Admit: 2012-04-28 | Discharge: 2012-04-28 | Disposition: A | Discharge status: Home / Self Care | Payer: Medicaid Other | Attending: Emergency Medicine | Admitting: Emergency Medicine

## 2012-04-28 DIAGNOSIS — E119 Type 2 diabetes mellitus without complications: Secondary | ICD-10-CM | POA: Insufficient documentation

## 2012-04-28 DIAGNOSIS — J40 Bronchitis, not specified as acute or chronic: Secondary | ICD-10-CM

## 2012-04-28 DIAGNOSIS — Z888 Allergy status to other drugs, medicaments and biological substances status: Secondary | ICD-10-CM | POA: Insufficient documentation

## 2012-04-28 DIAGNOSIS — Z881 Allergy status to other antibiotic agents status: Secondary | ICD-10-CM | POA: Insufficient documentation

## 2012-04-28 DIAGNOSIS — Z794 Long term (current) use of insulin: Secondary | ICD-10-CM | POA: Insufficient documentation

## 2012-04-28 HISTORY — DX: Heart failure, unspecified: I50.9

## 2012-04-28 MED ORDER — ALBUTEROL SULFATE HFA 108 (90 BASE) MCG/ACT IN AERS
2.0000 | INHALATION_SPRAY | RESPIRATORY_TRACT | Status: DC | PRN
  Administered 2012-04-28: 2 via RESPIRATORY_TRACT
  Filled 2012-04-28: qty 6.7

## 2012-04-28 MED ORDER — AEROCHAMBER Z-STAT PLUS/MEDIUM MISC
1.0000 | Freq: Once | Status: AC
  Administered 2012-04-28: 1
  Filled 2012-04-28: qty 1

## 2012-04-28 MED ORDER — ALBUTEROL SULFATE (5 MG/ML) 0.5% IN NEBU
INHALATION_SOLUTION | RESPIRATORY_TRACT | Status: AC
  Filled 2012-04-28: qty 2

## 2012-04-28 MED ORDER — IPRATROPIUM BROMIDE 0.02 % IN SOLN
RESPIRATORY_TRACT | Status: AC
  Filled 2012-04-28: qty 2.5

## 2012-04-28 MED ORDER — PREDNISONE 20 MG PO TABS
60.0000 mg | ORAL_TABLET | Freq: Once | ORAL | Status: AC
  Administered 2012-04-28: 60 mg via ORAL
  Filled 2012-04-28: qty 3

## 2012-04-28 MED ORDER — PREDNISONE 20 MG PO TABS: 20.0000 mg | ORAL_TABLET | Freq: Two times a day (BID) | ORAL | Status: AC

## 2012-04-28 NOTE — ED Provider Notes (Signed)
History     CSN: [id number]  Arrival date & time 04/28/12  1924   First MD Initiated Contact with Patient 04/28/12 2244      Chief Complaint  Patient presents with  . Asthma    (Consider location/radiation/quality/duration/timing/severity/associated sxs/prior treatment) HPI Comments: [name] is a 56 y.o. Female who began having trouble breathing. 2 days ago. She has cough, productive of clear sputum. There's been no blood in sputum. She tried a inhale at home without improvement. She denies recent fever, chills, nausea, vomiting, back, pain, weakness, or dizziness. She denies any known aggravating or palliative factors.  The history is provided by the patient.    Past Medical History  Diagnosis Date  . Diabetes mellitus   . Morbid obesity   . Obstructive airway disease   . Hyperlipidemia   . Reactive airway disease   . CHF (congestive heart failure)     Past Surgical History  Procedure Date  . Abdominal hysterectomy   . Eye surgery     History reviewed. No pertinent family history.  History  Substance Use Topics  . Smoking status: Never Smoker   . Smokeless tobacco: Never Used  . Alcohol Use: No     last drink months ago    OB History    Grav Para Term Preterm Abortions TAB SAB Ect Mult Living                  Review of Systems  All other systems reviewed and are negative.    Allergies  Amoxicillin; Azor; and Thiazide-type diuretics  Home Medications   Current Outpatient Rx  Name Route Sig Dispense Refill  . ALBUTEROL SULFATE HFA 108 (90 BASE) MCG/ACT IN AERS Inhalation Inhale 1 puff into the lungs every 4 (four) hours as needed. For shortness of breath. 1 Inhaler 0  . AMLODIPINE BESYLATE 5 MG PO TABS Oral Take 5 mg by mouth daily.    . ASPIRIN 325 MG PO TABS Oral Take 325 mg by mouth daily as needed. pain    . INSULIN GLARGINE 100 UNIT/ML [place] SOLN Subcutaneous Inject 75 Units into the skin 2 (two) times daily. 10 mL 0  . INSULIN LISPRO (HUMAN)  100 UNIT/ML [place] SOLN Subcutaneous Inject 20 Units into the skin 3 (three) times daily before meals.    Marland Kitchen METOPROLOL SUCCINATE ER 100 MG PO TB24 Oral Take 100 mg by mouth daily. Take with or immediately following a meal.    . QUINAPRIL HCL 40 MG PO TABS Oral Take 1 tablet (40 mg total) by mouth daily. 30 tablet 0  . SIMVASTATIN 40 MG PO TABS Oral Take 1 tablet (40 mg total) by mouth at bedtime. 30 tablet 0  . PREDNISONE 20 MG PO TABS Oral Take 1 tablet (20 mg total) by mouth 2 (two) times daily. 10 tablet 0    BP 123/60  Pulse 82  Temp 98.5 F (36.9 C) (Oral)  Resp 24  SpO2 98%  Physical Exam  Nursing note and vitals reviewed. Constitutional: She is oriented to person, place, and time. She appears well-developed and well-nourished.  HENT:  Head: Normocephalic and atraumatic.  Eyes: Conjunctivae and EOM are normal. Pupils are equal, round, and reactive to light.  Neck: Normal range of motion and phonation normal. Neck supple.  Cardiovascular: Normal rate, regular rhythm and intact distal pulses.   Pulmonary/Chest: Effort normal. No respiratory distress. She has wheezes. She has no rales. She exhibits no tenderness.  Mildly prolonged expiration  Abdominal: Soft. She exhibits no distension. There is no tenderness. There is no guarding.  Musculoskeletal: Normal range of motion. She exhibits no edema and no tenderness.  Neurological: She is alert and oriented to person, place, and time. She has normal strength. She exhibits normal muscle tone.  Skin: Skin is warm and dry.  Psychiatric: She has a normal mood and affect. Her behavior is normal. Judgment and thought content normal.    ED Course  Procedures (including critical care time) Emergency department treatment: Nebulizer,  Inhaler, prednisone   Labs Reviewed - No data to display No results found.   1. Bronchitis       MDM  Recurrent bronchitis, without evidence for pneumonia, PE, ACS.Doubt metabolic instability,  serious bacterial infection or impending vascular collapse; the patient is stable for discharge.    Plan: Home Medications- Prednisone, Inhaler; Home Treatments- rest; Recommended follow up- PCP prn    Flint Melter, MD 04/29/12 2339

## 2012-04-28 NOTE — ED Notes (Signed)
Pt reports cold like sx x 2 days; worsening SHOB today w/ wheezing; no relief from albuterol inhaler & neb at home; inspiratory and expiratory wheezes upon EMS arrival; no accessory muscle use; Pt received 10mg  of Albuterol and 0.5 mg of Atrovent in route w/ moderate relief.

## 2013-07-30 ENCOUNTER — Emergency Department (HOSPITAL_COMMUNITY): Payer: Medicaid Other

## 2013-07-30 ENCOUNTER — Emergency Department (HOSPITAL_COMMUNITY)
Admission: EM | Admit: 2013-07-30 | Discharge: 2013-07-30 | Disposition: A | Discharge status: Home / Self Care | Payer: Medicaid Other | Attending: Emergency Medicine | Admitting: Emergency Medicine

## 2013-07-30 DIAGNOSIS — E119 Type 2 diabetes mellitus without complications: Secondary | ICD-10-CM | POA: Insufficient documentation

## 2013-07-30 DIAGNOSIS — R0600 Dyspnea, unspecified: Secondary | ICD-10-CM

## 2013-07-30 DIAGNOSIS — R000 Tachycardia, unspecified: Secondary | ICD-10-CM | POA: Insufficient documentation

## 2013-07-30 DIAGNOSIS — Z79899 Other long term (current) drug therapy: Secondary | ICD-10-CM | POA: Insufficient documentation

## 2013-07-30 DIAGNOSIS — J441 Chronic obstructive pulmonary disease with (acute) exacerbation: Secondary | ICD-10-CM | POA: Insufficient documentation

## 2013-07-30 DIAGNOSIS — I509 Heart failure, unspecified: Secondary | ICD-10-CM | POA: Insufficient documentation

## 2013-07-30 DIAGNOSIS — E785 Hyperlipidemia, unspecified: Secondary | ICD-10-CM | POA: Insufficient documentation

## 2013-07-30 DIAGNOSIS — Z794 Long term (current) use of insulin: Secondary | ICD-10-CM | POA: Insufficient documentation

## 2013-07-30 LAB — CBC WITH DIFFERENTIAL/PLATELET
Basophils Relative: 0 % (ref 0–1)
Eosinophils Absolute: 0.1 10*3/uL (ref 0.0–0.7)
Eosinophils Relative: 1 % (ref 0–5)
Lymphs Abs: 2.2 10*3/uL (ref 0.7–4.0)
MCH: 27.6 pg (ref 26.0–34.0)
MCHC: 30.6 g/dL (ref 30.0–36.0)
MCV: 90.1 fL (ref 78.0–100.0)
Monocytes Relative: 7 % (ref 3–12)
Neutrophils Relative %: 72 % (ref 43–77)
Platelets: 297 10*3/uL (ref 150–400)

## 2013-07-30 LAB — COMPREHENSIVE METABOLIC PANEL
Albumin: 3.4 g/dL — ABNORMAL LOW (ref 3.5–5.2)
Alkaline Phosphatase: 263 U/L — ABNORMAL HIGH (ref 39–117)
BUN: 17 mg/dL (ref 6–23)
Calcium: 9.1 mg/dL (ref 8.4–10.5)
GFR calc Af Amer: 56 mL/min — ABNORMAL LOW (ref >90)
Glucose, Bld: 280 mg/dL — ABNORMAL HIGH (ref 70–99)
Potassium: 3.9 mEq/L (ref 3.5–5.1)
Sodium: 133 mEq/L — ABNORMAL LOW (ref 135–145)
Total Protein: 8.3 g/dL (ref 6.0–8.3)

## 2013-07-30 LAB — PRO B NATRIURETIC PEPTIDE: Pro B Natriuretic peptide (BNP): 627.2 pg/mL — ABNORMAL HIGH (ref 0–125)

## 2013-07-30 MED ORDER — PREDNISONE 20 MG PO TABS: 60.0000 mg | ORAL_TABLET | Freq: Every day | ORAL | Status: AC

## 2013-07-30 MED ORDER — PREDNISONE 20 MG PO TABS
60.0000 mg | ORAL_TABLET | Freq: Every day | ORAL | Status: DC
  Administered 2013-07-30: 60 mg via ORAL
  Filled 2013-07-30: qty 3

## 2013-07-30 MED ORDER — ALBUTEROL SULFATE (5 MG/ML) 0.5% IN NEBU
5.0000 mg | INHALATION_SOLUTION | Freq: Once | RESPIRATORY_TRACT | Status: AC
  Administered 2013-07-30: 5 mg via RESPIRATORY_TRACT
  Filled 2013-07-30: qty 1

## 2013-07-30 NOTE — ED Provider Notes (Signed)
CSN: [id number]     Arrival date & time 07/30/13  1827 History   First MD Initiated Contact with Patient 07/30/13 1833     Chief Complaint  Patient presents with  . Shortness of Breath   (Consider location/radiation/quality/duration/timing/severity/associated sxs/prior Treatment) HPI Patient presents with dyspnea.  Symptoms began progressively worse over the past day.  Onset is unclear, and it seemed to have been in the past few days, though symptoms were minimal on today. There is associated mild cough, but no testing, no belly pain, no vomiting, no diarrhea.  Patient has had minimal improvement with inhaled medication. Patient received albuterol en route today EMS, had some improvement. No fever, no chills, no recent sick contacts. Patient does state that she has asthma, CHF. Patient lives in an environment in which she is currently exposed to secondhand smoke frequently.  Past Medical History  Diagnosis Date  . Diabetes mellitus   . Morbid obesity   . Obstructive airway disease   . Hyperlipidemia   . Reactive airway disease   . CHF (congestive heart failure)    Past Surgical History  Procedure Laterality Date  . Abdominal hysterectomy    . Eye surgery     No family history on file. History  Substance Use Topics  . Smoking status: Never Smoker   . Smokeless tobacco: Never Used  . Alcohol Use: No     Comment: last drink months ago   OB History   Grav Para Term Preterm Abortions TAB SAB Ect Mult Living                 Review of Systems  Constitutional:       Per HPI, otherwise negative  HENT:       Per HPI, otherwise negative  Respiratory:       Per HPI, otherwise negative  Cardiovascular:       Per HPI, otherwise negative  Gastrointestinal: Negative for vomiting.  Endocrine:       Negative aside from HPI  Genitourinary:       Neg aside from HPI   Musculoskeletal:       Per HPI, otherwise negative  Skin: Negative.   Neurological: Negative for syncope.     Allergies  Amoxicillin; Azor; and Thiazide-type diuretics  Home Medications   Current Outpatient Rx  Name  Route  Sig  Dispense  Refill  . albuterol (PROVENTIL HFA;VENTOLIN HFA) 108 (90 BASE) MCG/ACT inhaler   Inhalation   Inhale 1 puff into the lungs every 4 (four) hours as needed. For shortness of breath.   1 Inhaler   0   . amLODipine (NORVASC) 5 MG tablet   Oral   Take 5 mg by mouth daily.         Marland Kitchen aspirin 325 MG tablet   Oral   Take 325 mg by mouth daily as needed. pain         . insulin glargine (LANTUS) 100 UNIT/ML injection   Subcutaneous   Inject 75 Units into the skin 2 (two) times daily.   10 mL   0   . EXPIRED: insulin lispro (HUMALOG) 100 UNIT/ML injection   Subcutaneous   Inject 20 Units into the skin 3 (three) times daily before meals.         . metoprolol succinate (TOPROL-XL) 100 MG 24 hr tablet   Oral   Take 100 mg by mouth daily. Take with or immediately following a meal.         .  quinapril (ACCUPRIL) 40 MG tablet   Oral   Take 1 tablet (40 mg total) by mouth daily.   30 tablet   0   . simvastatin (ZOCOR) 40 MG tablet   Oral   Take 1 tablet (40 mg total) by mouth at bedtime.   30 tablet   0    Pulse 103  Resp 27  SpO2 97% Physical Exam  Nursing note and vitals reviewed. Constitutional: She is oriented to person, place, and time. She appears well-developed and well-nourished. No distress.  HENT:  Head: Normocephalic and atraumatic.  Eyes: Conjunctivae and EOM are normal.  Cardiovascular: Regular rhythm.  Tachycardia present.   Pulmonary/Chest: No stridor. Tachypnea noted. No respiratory distress. She has decreased breath sounds. She has wheezes.  Abdominal: She exhibits no distension.  Musculoskeletal: She exhibits no edema.  Neurological: She is alert and oriented to person, place, and time. No cranial nerve deficit.  Skin: Skin is warm and dry.  Psychiatric: She has a normal mood and affect.    ED Course   Procedures (including critical care time) Labs Review Labs Reviewed  CBC WITH DIFFERENTIAL - Abnormal; Notable for the following:    WBC 11.2 (*)    Hemoglobin 10.8 (*)    HCT 35.3 (*)    Neutro Abs 8.0 (*)    All other components within normal limits  COMPREHENSIVE METABOLIC PANEL - Abnormal; Notable for the following:    Sodium 133 (*)    Glucose, Bld 280 (*)    Creatinine, Ser 1.20 (*)    Albumin 3.4 (*)    AST 90 (*)    ALT 88 (*)    Alkaline Phosphatase 263 (*)    GFR calc non Af Amer 48 (*)    GFR calc Af Amer 56 (*)    All other components within normal limits  PRO B NATRIURETIC PEPTIDE - Abnormal; Notable for the following:    Pro B Natriuretic peptide (BNP) 627.2 (*)    All other components within normal limits  URINALYSIS, ROUTINE W REFLEX MICROSCOPIC  POCT I-STAT TROPONIN I   Imaging Review Dg Chest 2 View  07/30/2013   CLINICAL DATA:  Shortness of breath.  EXAM: CHEST  2 VIEW  COMPARISON:  Earlier film, same date.  FINDINGS: Persistent cardiac enlargement and central vascular congestion. Slight improved lung aeration with resolving interstitial edema.  IMPRESSION: Slight improved lung aeration with probable resolving interstitial edema.   Electronically Signed   By: Loralie Champagne M.D.   On: 07/30/2013 19:31   Dg Chest Portable 1 View  07/30/2013   CLINICAL DATA:  Shortness of Breath  EXAM: PORTABLE CHEST - 1 VIEW  COMPARISON:  08/06/2011  FINDINGS: Cardiomediastinal silhouette is stable.Central vascular congestion, bilateral mild interstitial prominence with hazy airspace disease highly suspicious for bilateral pulmonary edema rather than bilateral pneumonia. Clinical correlation is necessary. Follow-up examination is recommended.  IMPRESSION: Central vascular congestion, bilateral mild interstitial prominence with hazy airspace disease highly suspicious for bilateral pulmonary edema rather than bilateral pneumonia. Clinical correlation is necessary. Follow-up  examination is recommended.   Electronically Signed   By: Natasha Mead M.D.   On: 07/30/2013 18:49    EKG Interpretation    Date/Time:  Sunday July 30 2013 18:35:01 EST Ventricular Rate:  105 PR Interval:  170 QRS Duration: 86 QT Interval:  344 QTC Calculation: 455 R Axis:   11 Text Interpretation:  Sinus tachycardia Borderline low voltage, extremity leads Borderline repolarization abnormality Minimal ST elevation, inferior leads  Baseline wander in lead(s) V1 Sinus tachycardia Artifact Abnormal ekg Confirmed by Gerhard Munch  MD [phone]) on 07/30/2013 9:11:19 PM           9:10 PM Patient feels substantially better.  Lung sounds are better.  Vital signs are stable.  10:28 PM Patient requests d/c.  LS are better. No new complaints.  F/U and return precautions discussed. MDM  No diagnosis found. This patient with asthma, CHF now presents with dyspnea.  Initially the patient's tachypnea, tachycardia hypoxic, and has appreciable wheezing.  Patient improved substantially here, with substantial improvement in vital signs, no decompensation.  With this improvement, and labs are abnormal, though not remarkably distinct from prior studies, the patient was discharged in stable condition to follow up with her primary care team.    Gerhard Munch, MD 07/30/13 2228

## 2013-07-30 NOTE — ED Notes (Signed)
Pt states that today she started having SOB that got progressively worse. Hx of asthma. Sats in low 80s before neb. Albuterol tx in EMS truck. Did not tolerate [place] afterwards. Given duo neb. Sats 100 on neb.

## 2013-07-30 NOTE — ED Notes (Signed)
Bed: RESA Expected date:  Expected time:  Means of arrival:  Comments: EMS/dyspnea 

## 2014-04-02 ENCOUNTER — Ambulatory Visit (INDEPENDENT_AMBULATORY_CARE_PROVIDER_SITE_OTHER): Payer: Medicaid Other | Admitting: Family Medicine

## 2014-04-02 ENCOUNTER — Encounter: Payer: Self-pay | Admitting: Family Medicine

## 2014-04-02 ENCOUNTER — Ambulatory Visit (HOSPITAL_COMMUNITY)
Admission: RE | Admit: 2014-04-02 | Discharge: 2014-04-02 | Disposition: A | Discharge status: Home / Self Care | Payer: Medicaid Other | Source: Ambulatory Visit | Attending: Family Medicine | Admitting: Family Medicine

## 2014-04-02 VITALS — BP 130/80 | HR 52 | Temp 98.2°F | Ht 63.0 in | Wt 310.4 lb

## 2014-04-02 DIAGNOSIS — R0989 Other specified symptoms and signs involving the circulatory and respiratory systems: Secondary | ICD-10-CM | POA: Diagnosis present

## 2014-04-02 DIAGNOSIS — R0609 Other forms of dyspnea: Secondary | ICD-10-CM | POA: Diagnosis not present

## 2014-04-02 DIAGNOSIS — E1165 Type 2 diabetes mellitus with hyperglycemia: Secondary | ICD-10-CM

## 2014-04-02 DIAGNOSIS — D649 Anemia, unspecified: Secondary | ICD-10-CM

## 2014-04-02 DIAGNOSIS — E785 Hyperlipidemia, unspecified: Secondary | ICD-10-CM

## 2014-04-02 DIAGNOSIS — I10 Essential (primary) hypertension: Secondary | ICD-10-CM

## 2014-04-02 DIAGNOSIS — R0600 Dyspnea, unspecified: Secondary | ICD-10-CM

## 2014-04-02 DIAGNOSIS — I5032 Chronic diastolic (congestive) heart failure: Secondary | ICD-10-CM | POA: Insufficient documentation

## 2014-04-02 DIAGNOSIS — G4733 Obstructive sleep apnea (adult) (pediatric): Secondary | ICD-10-CM

## 2014-04-02 DIAGNOSIS — IMO0001 Reserved for inherently not codable concepts without codable children: Secondary | ICD-10-CM

## 2014-04-02 LAB — POCT GLYCOSYLATED HEMOGLOBIN (HGB A1C): Hemoglobin A1C: 8.3

## 2014-04-02 MED ORDER — FUROSEMIDE 40 MG PO TABS: 40.0000 mg | ORAL_TABLET | Freq: Every day | ORAL | Status: DC

## 2014-04-02 NOTE — Assessment & Plan Note (Signed)
Patient reports taking large doses of insulin including Lantus 95 units BID and Humulin R 20 units 3 times a day before meals. She believes that her last hemoglobin A1c was improved at 10.0.  Will check hemoglobin A1c today.

## 2014-04-02 NOTE — Assessment & Plan Note (Signed)
Will check lipid panel today and consider need for high intensity statin

## 2014-04-02 NOTE — Assessment & Plan Note (Signed)
H/o anemia. CBC checked today.

## 2014-04-02 NOTE — Assessment & Plan Note (Deleted)
Will check lipid panel today and consider need for high intensity statin.

## 2014-04-02 NOTE — Progress Notes (Signed)
Subjective:   [name] is a 58 y.o. female with a history of T2DM, HTN, HLD, ?CHF here to establish care and p/w dyspnea and LE edema.  1. New patient establishing care: reviewed PMH, Social hx, medications, FmHx.  Updates made to chart as necessary. Records beign requested from previous PCP at [facility] [name]).  No record of recent A1c, CMP, lipid panel, CBC.  2. Dyspnea and LE edema: Patient reports intermittent bilateral lower extremity edema times many years. She worse this is worse when the weather is hot and worse when she is sitting more often than usual. She also reports increased shortness of breath for the last one month. She is now getting short of breath just walking from her apartment to her car, which is new. She is using her albuterol inhaler approximately 3 times a day recently for her shortness of breath. She thinks this is improving her shortness of breath temporarily, but it is worse as soon she increases activity again. She reports that she's gained 8 pounds approximately in the last 2 months. She reports that she's never used Lasix, but she thinks that she has a history of heart failure.  Per report, her last PCP recently stopped her ACE inhibitor. She believes is related to her kidney function. She has never seen a cardiologist, but believes her last PCP was considering a referral to cardiology. She denies any chest pain.  Of note, she is only taking her baby aspirin intermittently when she feels like she needs it.  3. Healthcare maintenance: Patient never had a colonoscopy. She would not like a colonoscopy, but rather prefers FOBT cards. She had a mammogram approximately 2 years ago.  Review of Systems:  Per HPI. All other systems reviewed and are negative.    Past Medical History: Patient Active Problem List   Diagnosis Date Noted  . Acute respiratory failure with hypoxia 08/05/2011  . Rhabdomyolysis 08/05/2011  . Dehydration 08/05/2011  . Metabolic  encephalopathy 73/22/0254  . Fever 08/04/2011  . Leukocytosis (leucocytosis) 08/04/2011  . Tachycardia - pulse 08/04/2011  . Hyponatremia 08/04/2011  . Hyperosmolar nonketotic coma in diabetes 08/04/2011  . Morbid obesity   . Reactive airway disease   . Obstructive airway disease   . Hyperlipidemia     Medications: reviewed and updated Current Outpatient Prescriptions  Medication Sig Dispense Refill  . albuterol (PROVENTIL HFA;VENTOLIN HFA) 108 (90 BASE) MCG/ACT inhaler Inhale 1 puff into the lungs every 4 (four) hours as needed. For shortness of breath.  1 Inhaler  0  . albuterol (PROVENTIL) (2.5 MG/3ML) 0.083% nebulizer solution Take 2.5 mg by nebulization every 6 (six) hours as needed for wheezing or shortness of breath (SOB).      Marland Kitchen amLODipine (NORVASC) 5 MG tablet Take 5 mg by mouth daily.      Marland Kitchen aspirin 325 MG tablet Take 325 mg by mouth daily as needed. pain      . beclomethasone (QVAR) 40 MCG/ACT inhaler Inhale 2 puffs into the lungs 2 (two) times daily.      . furosemide (LASIX) 40 MG tablet Take 1 tablet (40 mg total) by mouth daily.  30 tablet  0  . insulin glargine (LANTUS) 100 UNIT/ML injection Inject 90 Units into the skin 2 (two) times daily.      . insulin lispro (HUMALOG) 100 UNIT/ML injection Inject 20 Units into the skin 3 (three) times daily before meals.      . insulin regular (NOVOLIN R,HUMULIN R) 100 units/mL injection  Inject 20 Units into the skin 3 (three) times daily before meals.      . metoprolol succinate (TOPROL-XL) 100 MG 24 hr tablet Take 100 mg by mouth daily. Take with or immediately following a meal.      . triamterene-hydrochlorothiazide (MAXZIDE-25) 37.5-25 MG per tablet Take 1 tablet by mouth daily.       No current facility-administered medications for this visit.    Objective:  BP 130/80  Pulse 52  Temp(Src) 98.2 F (36.8 C) (Oral)  Ht 5\' 3"  (1.6 m)  Wt 310 lb 6.4 oz (140.797 kg)  BMI 55.00 kg/m2.  SpO2 91% at rest on RA  Gen:  58 y.o.  female in NAD HEENT: MMM, EOMI, PERRL, anicteric sclerae CV: RRR, no MRG, no JVD Resp: Non-labored, CTAB, no wheezes/crackles noted Abd: Soft, NTND, BS present, no guarding or organomegaly MSK: 2+ pitting edema to level of knee b/l, full ROM Neuro: Alert and oriented, speech normal      Chemistry      Component Value Date/Time   NA 133* 07/30/2013 1840   K 3.9 07/30/2013 1840   CL 96 07/30/2013 1840   CO2 26 07/30/2013 1840   BUN 17 07/30/2013 1840   CREATININE 1.20* 07/30/2013 1840      Component Value Date/Time   CALCIUM 9.1 07/30/2013 1840   ALKPHOS 263* 07/30/2013 1840   AST 90* 07/30/2013 1840   ALT 88* 07/30/2013 1840   BILITOT 0.6 07/30/2013 1840      Lab Results  Component Value Date   WBC 11.2* 07/30/2013   HGB 10.8* 07/30/2013   HCT 35.3* 07/30/2013   MCV 90.1 07/30/2013   PLT 297 07/30/2013    Lab Results  Component Value Date   HGBA1C 8.8* 08/05/2011    EKG (completed in office today): NSR, nonspecific T wave abnormalities in the lateral leads. No concern for ischemia currently.  Assessment:     [name] is a 58 y.o. female here for establishing care with new PCP and dyspnea and lower extremity edema.    Plan:     See problem list for problem-specific plans.   Lavon Paganini, MD PGY-1,  [place]

## 2014-04-02 NOTE — Assessment & Plan Note (Signed)
BP well-controlled today. Continue current medication regimen. We'll check CMP today.

## 2014-04-02 NOTE — Patient Instructions (Signed)
It was nice to meet you today.  I think that your shortness of breath and leg swelling and weight gain may be related to your history of heart failure.  I am ordering several labs and will call you with the results in the next few days.  I am ordering a medication called Lasix that I would like you to start taking every day.  It is also important to take the baby aspirin every day.  I am also referring him to cardiology office. You should be hearing from someone soon about scheduling an appointment with cardiology.  Please take your weight every morning around the same time on the same scale and record these results. We will like to see them next time you come to clinic.  I like for you to come back to clinic this Friday or next Monday to see someone again about your shortness of breath and leg swelling.  You should receive information about doing the stool cards like we talked about. Please follow the instructions carefully and return the cards when you have completed them. You can call the clinic regarding her left mammogram to schedule another screening mammogram for this year.  Best, Dr. Jacinto Reap Brita Romp)   Shortness of Breath Shortness of breath means you have trouble breathing. It could also mean that you have a medical problem. You should get immediate medical care for shortness of breath. CAUSES   Not enough oxygen in the air such as with high altitudes or a smoke-filled room.  Certain lung diseases, infections, or problems.  Heart disease or conditions, such as angina or heart failure.  Low red blood cells (anemia).  Poor physical fitness, which can cause shortness of breath when you exercise.  Chest or back injuries or stiffness.  Being overweight.  Smoking.  Anxiety, which can make you feel like you are not getting enough air. DIAGNOSIS  Serious medical problems can often be found during your physical exam. Tests may also be done to determine why you are having shortness of  breath. Tests may include:  Chest X-rays.  Lung function tests.  Blood tests.  An electrocardiogram (ECG).  An ambulatory electrocardiogram. An ambulatory ECG records your heartbeat patterns over a 24-hour period.  Exercise testing.  A transthoracic echocardiogram (TTE). During echocardiography, sound waves are used to evaluate how blood flows through your heart.  A transesophageal echocardiogram (TEE).  Imaging scans. Your health care provider may not be able to find a cause for your shortness of breath after your exam. In this case, it is important to have a follow-up exam with your health care provider as directed.  TREATMENT  Treatment for shortness of breath depends on the cause of your symptoms and can vary greatly. HOME CARE INSTRUCTIONS   Do not smoke. Smoking is a common cause of shortness of breath. If you smoke, ask for help to quit.  Avoid being around chemicals or things that may bother your breathing, such as paint fumes and dust.  Rest as needed. Slowly resume your usual activities.  If medicines were prescribed, take them as directed for the full length of time directed. This includes oxygen and any inhaled medicines.  Keep all follow-up appointments as directed by your health care provider. SEEK MEDICAL CARE IF:   Your condition does not improve in the time expected.  You have a hard time doing your normal activities even with rest.  You have any new symptoms. SEEK IMMEDIATE MEDICAL CARE IF:   Your shortness of  breath gets worse.  You feel light-headed, faint, or develop a cough not controlled with medicines.  You start coughing up blood.  You have pain with breathing.  You have chest pain or pain in your arms, shoulders, or abdomen.  You have a fever.  You are unable to walk up stairs or exercise the way you normally do. MAKE SURE YOU:  Understand these instructions.  Will watch your condition.  Will get help right away if you are not  doing well or get worse. Document Released: 05/05/2001 Document Revised: 08/15/2013 Document Reviewed: 10/26/2011 [facility] Patient Information 2015 [place], [place]. This information is not intended to replace advice given to you by your health care provider. Make sure you discuss any questions you have with your health care provider.

## 2014-04-02 NOTE — Assessment & Plan Note (Addendum)
Patient reports history of heart failure. Concerned for her CHF exacerbation today. Respiratory status is stable. EKG non-concerning and no CP. Lasix by mouth 40 mg daily prescribed as patient is Lasix naive per report.  Will check chest x-ray, BNP, Echo, and refer to cardiology.  Checking CMP to evaluate renal function.  Can consider restarting ACE inhibitor if renal function is ok.  Patient advised to take her baby aspirin every day. Patient advised to check daily weights if possible, but she does not on a scale. Will followup on results from testing tomorrow and call the patient to see how she's doing. Followup in one week.

## 2014-04-03 ENCOUNTER — Telehealth: Payer: Self-pay | Admitting: Family Medicine

## 2014-04-03 LAB — CBC
HCT: 40.5 % (ref 36.0–46.0)
Hemoglobin: 12.9 g/dL (ref 12.0–15.0)
MCH: 26.4 pg (ref 26.0–34.0)
MCHC: 31.9 g/dL (ref 30.0–36.0)
MCV: 82.8 fL (ref 78.0–100.0)
Platelets: 372 10*3/uL (ref 150–400)
RBC: 4.89 MIL/uL (ref 3.87–5.11)
RDW: 15.8 % — AB (ref 11.5–15.5)
WBC: 10.8 10*3/uL — AB (ref 4.0–10.5)

## 2014-04-03 LAB — COMPLETE METABOLIC PANEL WITH GFR
ALT: 28 U/L (ref 0–35)
AST: 25 U/L (ref 0–37)
Albumin: 4.2 g/dL (ref 3.5–5.2)
Alkaline Phosphatase: 229 U/L — ABNORMAL HIGH (ref 39–117)
BUN: 18 mg/dL (ref 6–23)
CALCIUM: 9.5 mg/dL (ref 8.4–10.5)
CHLORIDE: 101 meq/L (ref 96–112)
CO2: 29 meq/L (ref 19–32)
CREATININE: 1.2 mg/dL — AB (ref 0.50–1.10)
GFR, EST AFRICAN AMERICAN: 57 mL/min — AB
GFR, EST NON AFRICAN AMERICAN: 49 mL/min — AB
Glucose, Bld: 68 mg/dL — ABNORMAL LOW (ref 70–99)
Potassium: 4 mEq/L (ref 3.5–5.3)
Sodium: 141 mEq/L (ref 135–145)
Total Bilirubin: 0.4 mg/dL (ref 0.2–1.2)
Total Protein: 8 g/dL (ref 6.0–8.3)

## 2014-04-03 LAB — LIPID PANEL
CHOL/HDL RATIO: 3.5 ratio
Cholesterol: 170 mg/dL (ref 0–200)
HDL: 49 mg/dL (ref >39)
LDL Cholesterol: 98 mg/dL (ref 0–99)
Triglycerides: 117 mg/dL (ref <150)
VLDL: 23 mg/dL (ref 0–40)

## 2014-04-03 LAB — PRO B NATRIURETIC PEPTIDE: Pro B Natriuretic peptide (BNP): 192.5 pg/mL — ABNORMAL HIGH (ref <126)

## 2014-04-03 NOTE — Telephone Encounter (Signed)
Called [name] today to discuss laboratory results and f/u on dyspnea.    Results for orders placed in visit on 04/02/14 (from the past 24 hour(s))  LIPID PANEL     Status: None   Collection Time    04/02/14  5:00 PM      Result Value Ref Range   Cholesterol 170  0 - 200 mg/dL   Triglycerides 117  <150 mg/dL   HDL 49  >39 mg/dL   Total CHOL/HDL Ratio 3.5     VLDL 23  0 - 40 mg/dL   LDL Cholesterol 98  0 - 99 mg/dL   Narrative:    Performed at:  [address], [address]                [place], [place] [address]  COMPLETE METABOLIC PANEL WITH GFR     Status: Abnormal   Collection Time    04/02/14  5:00 PM      Result Value Ref Range   Sodium 141  135 - 145 mEq/L   Potassium 4.0  3.5 - 5.3 mEq/L   Chloride 101  96 - 112 mEq/L   CO2 29  19 - 32 mEq/L   Glucose, Bld 68 (*) 70 - 99 mg/dL   BUN 18  6 - 23 mg/dL   Creat 1.20 (*) 0.50 - 1.10 mg/dL   Total Bilirubin 0.4  0.2 - 1.2 mg/dL   Alkaline Phosphatase 229 (*) 39 - 117 U/L   AST 25  0 - 37 U/L   ALT 28  0 - 35 U/L   Total Protein 8.0  6.0 - 8.3 g/dL   Albumin 4.2  3.5 - 5.2 g/dL   Calcium 9.5  8.4 - 10.5 mg/dL   GFR, Est African American 57 (*)    GFR, Est Non African American 64 (*)    Narrative:    Performed at:  [address], [address]                [place], [place] [address]  PRO B NATRIURETIC PEPTIDE     Status: Abnormal   Collection Time    04/02/14  5:00 PM      Result Value Ref Range   Pro B Natriuretic peptide (BNP) 192.50 (*) <126 pg/mL   Narrative:    Performed at:  [address], [address]                , [place] [address]  CBC     Status: Abnormal   Collection Time    04/02/14  5:00 PM      Result Value Ref Range   WBC 10.8 (*) 4.0 - 10.5 K/uL   RBC 4.89  3.87 - 5.11 MIL/uL   Hemoglobin 12.9  12.0 - 15.0 g/dL   HCT 40.5  36.0 - 46.0 %   MCV 82.8  78.0 - 100.0 fL   MCH 26.4  26.0 -  34.0 pg   MCHC 31.9  30.0 - 36.0 g/dL   RDW 15.8 (*) 11.5 - 15.5 %   Platelets 372  150 - 400 K/uL   Narrative:    Performed at:  [facility]  [address], [address]                [place], [place] [address]    Pt reports that her breathing is the same as it was yesterday.  She is currently at the pharmacy to pick up her Lasix prescription.  She plans to take the first dose as soon as she gets it.  She has been unable to weigh herself at home as she does not have a scale.    She reports that the Hgb A1c of 8.3 is much improved for her (believes that last value was 10.0).  She reports <1 episode of hypoglycemia every 2 weeks.  A/P: BNP is not as elevated as previously, but still some concern for CHF exacerbation on exam yesterday, but respiratory status was stable for outpatient management.  Pt is Lasix naive per report, so will see if trial of Lasix improves fluid status.  Should use with care, given Cr of 1.2.  Consider d/c'ing Maxzide and resuming ACEi (stopped by last PCP) pending record review.   - Scheduled for Echo on 8/14 - F/u appt made at [facility] on 8/17 at 11:30 with Dr. Ree Kida and patient notified - Pt made appt with Cards on 9/11.

## 2014-04-05 ENCOUNTER — Ambulatory Visit
Admission: RE | Admit: 2014-04-05 | Discharge: 2014-04-05 | Disposition: A | Discharge status: Home / Self Care | Payer: Medicaid Other | Source: Ambulatory Visit | Attending: Family Medicine | Admitting: Family Medicine

## 2014-04-05 DIAGNOSIS — R0600 Dyspnea, unspecified: Secondary | ICD-10-CM

## 2014-04-06 ENCOUNTER — Ambulatory Visit (HOSPITAL_COMMUNITY): Payer: Medicaid Other | Attending: Cardiovascular Disease | Admitting: Radiology

## 2014-04-06 DIAGNOSIS — R0602 Shortness of breath: Secondary | ICD-10-CM

## 2014-04-06 DIAGNOSIS — I519 Heart disease, unspecified: Secondary | ICD-10-CM | POA: Diagnosis not present

## 2014-04-06 DIAGNOSIS — R0600 Dyspnea, unspecified: Secondary | ICD-10-CM

## 2014-04-06 NOTE — Progress Notes (Signed)
Echocardiogram performed.  

## 2014-04-09 ENCOUNTER — Ambulatory Visit (INDEPENDENT_AMBULATORY_CARE_PROVIDER_SITE_OTHER): Payer: Medicaid Other | Admitting: Family Medicine

## 2014-04-09 ENCOUNTER — Encounter: Payer: Self-pay | Admitting: Family Medicine

## 2014-04-09 VITALS — BP 132/68 | HR 81 | Temp 98.1°F | Ht 63.0 in | Wt 299.8 lb

## 2014-04-09 DIAGNOSIS — I509 Heart failure, unspecified: Secondary | ICD-10-CM

## 2014-04-09 DIAGNOSIS — I10 Essential (primary) hypertension: Secondary | ICD-10-CM

## 2014-04-09 DIAGNOSIS — R0600 Dyspnea, unspecified: Secondary | ICD-10-CM

## 2014-04-09 DIAGNOSIS — R0609 Other forms of dyspnea: Secondary | ICD-10-CM

## 2014-04-09 DIAGNOSIS — R0989 Other specified symptoms and signs involving the circulatory and respiratory systems: Secondary | ICD-10-CM

## 2014-04-09 DIAGNOSIS — I5032 Chronic diastolic (congestive) heart failure: Secondary | ICD-10-CM

## 2014-04-09 DIAGNOSIS — I5031 Acute diastolic (congestive) heart failure: Secondary | ICD-10-CM

## 2014-04-09 MED ORDER — FUROSEMIDE 40 MG PO TABS: 20.0000 mg | ORAL_TABLET | Freq: Every day | ORAL | Status: DC

## 2014-04-09 NOTE — Addendum Note (Signed)
Addended by: Lupita Dawn on: 04/09/2014 11:35 AM   Modules accepted: Orders

## 2014-04-09 NOTE — Progress Notes (Signed)
   Subjective:    Patient ID: [name], female    DOB: 1955-10-23, 58 y.o.   MRN: [id number]  HPI 58 y/o female presents for follow up of dyspnea and generalized swelling. Patient was evaluated in office last week by Dr. Brita Romp. There was clinical concern for CHF exacerbation. Patient sent for Echocardiogram, started on Lasix, and referred to Cardiology.  Patient reports significant improvement of symptoms, decreased sob, decreased swelling, no chest pain   Review of Systems  Constitutional: Negative for fever, chills and fatigue.  Respiratory: Negative for cough and shortness of breath.   Cardiovascular: Positive for leg swelling. Negative for chest pain.       Objective:   Physical Exam Vitals: reviewed Gen: pleasant AAF, NAD HEENT: normocephalic, PERRL, EOMI, MMM Cardiac: RRR, S1 and S2 present, no murmurs Resp: slightly diminished breath sounds in bases, clear otherwise, no increased work of breathing Ext: 1+ edema to mid shin bilaterally, 2+ radial pulses bilaterally    Echo 04/06/14 - EF 49-17%, Grade 2 diastolic dysfunction, mildly dilated left atrium CXR - cardiomegaly, coarse bases consistent with CHF, no infiltrate, no pleural effusion    Assessment & Plan:  Please see problem specific assessment and plan.

## 2014-04-09 NOTE — Assessment & Plan Note (Signed)
Blood pressure well controlled. -check BMP today since on lasix for one week -consider addition of Lisinopril at next visit if CR stable

## 2014-04-09 NOTE — Patient Instructions (Addendum)
It was nice to meet you today.  You have diastolic heart failure (abnormal relaxation of your heart). Decrease Lasix to 20 mg daily (may cut 40 mg tablet in half). Start to wear compression stocking (prescription provided).   Please return to see Dr. B in 1-2 weeks  Check lab work today to evaluate kidney function. Discuss restarting Lisinopril with Dr. B at that time.

## 2014-04-09 NOTE — Assessment & Plan Note (Signed)
Patient presents for follow up of dyspnea which is secondary to diastolic heart failure. Improved with lasix. Down 10 lbs since last visit.  -decrease lasix to 20 mg daily -check BMP to monitor kidney function -return for follow up in 1-2 weeks

## 2014-04-10 ENCOUNTER — Telehealth: Payer: Self-pay | Admitting: Family Medicine

## 2014-04-10 LAB — BASIC METABOLIC PANEL
BUN: 22 mg/dL (ref 6–23)
CO2: 30 meq/L (ref 19–32)
Calcium: 9.8 mg/dL (ref 8.4–10.5)
Chloride: 95 mEq/L — ABNORMAL LOW (ref 96–112)
Creat: 1.16 mg/dL — ABNORMAL HIGH (ref 0.50–1.10)
Glucose, Bld: 84 mg/dL (ref 70–99)
Potassium: 3.7 mEq/L (ref 3.5–5.3)
SODIUM: 140 meq/L (ref 135–145)

## 2014-04-10 NOTE — Telephone Encounter (Signed)
Discussed lab results with patient. 

## 2014-04-17 ENCOUNTER — Encounter: Payer: Self-pay | Admitting: Family Medicine

## 2014-04-29 ENCOUNTER — Emergency Department (HOSPITAL_COMMUNITY): Payer: Medicaid Other

## 2014-04-29 ENCOUNTER — Inpatient Hospital Stay (HOSPITAL_COMMUNITY)
Admission: EM | Admit: 2014-04-29 | Discharge: 2014-05-24 | DRG: 291 | Disposition: E | Payer: Medicaid Other | Attending: Family Medicine | Admitting: Family Medicine

## 2014-04-29 ENCOUNTER — Encounter (HOSPITAL_COMMUNITY): Payer: Self-pay | Admitting: Emergency Medicine

## 2014-04-29 DIAGNOSIS — I469 Cardiac arrest, cause unspecified: Secondary | ICD-10-CM | POA: Diagnosis not present

## 2014-04-29 DIAGNOSIS — N183 Chronic kidney disease, stage 3 unspecified: Secondary | ICD-10-CM | POA: Diagnosis present

## 2014-04-29 DIAGNOSIS — I509 Heart failure, unspecified: Secondary | ICD-10-CM | POA: Diagnosis present

## 2014-04-29 DIAGNOSIS — Z86718 Personal history of other venous thrombosis and embolism: Secondary | ICD-10-CM

## 2014-04-29 DIAGNOSIS — J984 Other disorders of lung: Secondary | ICD-10-CM

## 2014-04-29 DIAGNOSIS — Z818 Family history of other mental and behavioral disorders: Secondary | ICD-10-CM

## 2014-04-29 DIAGNOSIS — I5033 Acute on chronic diastolic (congestive) heart failure: Principal | ICD-10-CM | POA: Diagnosis present

## 2014-04-29 DIAGNOSIS — J45909 Unspecified asthma, uncomplicated: Secondary | ICD-10-CM | POA: Diagnosis present

## 2014-04-29 DIAGNOSIS — Z833 Family history of diabetes mellitus: Secondary | ICD-10-CM

## 2014-04-29 DIAGNOSIS — G4733 Obstructive sleep apnea (adult) (pediatric): Secondary | ICD-10-CM | POA: Diagnosis present

## 2014-04-29 DIAGNOSIS — A419 Sepsis, unspecified organism: Secondary | ICD-10-CM | POA: Diagnosis present

## 2014-04-29 DIAGNOSIS — I129 Hypertensive chronic kidney disease with stage 1 through stage 4 chronic kidney disease, or unspecified chronic kidney disease: Secondary | ICD-10-CM | POA: Diagnosis present

## 2014-04-29 DIAGNOSIS — E1165 Type 2 diabetes mellitus with hyperglycemia: Secondary | ICD-10-CM

## 2014-04-29 DIAGNOSIS — K761 Chronic passive congestion of liver: Secondary | ICD-10-CM | POA: Diagnosis present

## 2014-04-29 DIAGNOSIS — Z808 Family history of malignant neoplasm of other organs or systems: Secondary | ICD-10-CM

## 2014-04-29 DIAGNOSIS — Z841 Family history of disorders of kidney and ureter: Secondary | ICD-10-CM

## 2014-04-29 DIAGNOSIS — Z6841 Body Mass Index (BMI) 40.0 and over, adult: Secondary | ICD-10-CM

## 2014-04-29 DIAGNOSIS — Z888 Allergy status to other drugs, medicaments and biological substances status: Secondary | ICD-10-CM

## 2014-04-29 DIAGNOSIS — R0902 Hypoxemia: Secondary | ICD-10-CM

## 2014-04-29 DIAGNOSIS — IMO0001 Reserved for inherently not codable concepts without codable children: Secondary | ICD-10-CM

## 2014-04-29 DIAGNOSIS — Z8249 Family history of ischemic heart disease and other diseases of the circulatory system: Secondary | ICD-10-CM

## 2014-04-29 DIAGNOSIS — E785 Hyperlipidemia, unspecified: Secondary | ICD-10-CM | POA: Diagnosis present

## 2014-04-29 DIAGNOSIS — I10 Essential (primary) hypertension: Secondary | ICD-10-CM | POA: Diagnosis present

## 2014-04-29 DIAGNOSIS — J189 Pneumonia, unspecified organism: Secondary | ICD-10-CM

## 2014-04-29 DIAGNOSIS — Z602 Problems related to living alone: Secondary | ICD-10-CM

## 2014-04-29 DIAGNOSIS — J9620 Acute and chronic respiratory failure, unspecified whether with hypoxia or hypercapnia: Secondary | ICD-10-CM | POA: Diagnosis present

## 2014-04-29 DIAGNOSIS — E119 Type 2 diabetes mellitus without complications: Secondary | ICD-10-CM | POA: Diagnosis present

## 2014-04-29 DIAGNOSIS — Z881 Allergy status to other antibiotic agents status: Secondary | ICD-10-CM

## 2014-04-29 HISTORY — DX: Unspecified asthma, uncomplicated: J45.909

## 2014-04-29 HISTORY — DX: Transient cerebral ischemic attack, unspecified: G45.9

## 2014-04-29 HISTORY — DX: Procedure and treatment not carried out because of patient's decision for reasons of belief and group pressure: Z53.1

## 2014-04-29 HISTORY — DX: Shortness of breath: R06.02

## 2014-04-29 HISTORY — DX: Essential (primary) hypertension: I10

## 2014-04-29 LAB — CBC WITH DIFFERENTIAL/PLATELET
Basophils Absolute: 0 K/uL (ref 0.0–0.1)
Basophils Relative: 0 % (ref 0–1)
Eosinophils Absolute: 0.3 K/uL (ref 0.0–0.7)
Eosinophils Relative: 3 % (ref 0–5)
HCT: 38.1 % (ref 36.0–46.0)
Hemoglobin: 11.5 g/dL — ABNORMAL LOW (ref 12.0–15.0)
Lymphocytes Relative: 19 % (ref 12–46)
Lymphs Abs: 2.4 K/uL (ref 0.7–4.0)
MCH: 26.9 pg (ref 26.0–34.0)
MCHC: 30.2 g/dL (ref 30.0–36.0)
MCV: 89.2 fL (ref 78.0–100.0)
Monocytes Absolute: 0.8 K/uL (ref 0.1–1.0)
Monocytes Relative: 6 % (ref 3–12)
Neutro Abs: 9.2 K/uL — ABNORMAL HIGH (ref 1.7–7.7)
Neutrophils Relative %: 72 % (ref 43–77)
Platelets: 267 K/uL (ref 150–400)
RBC: 4.27 MIL/uL (ref 3.87–5.11)
RDW: 15.6 % — ABNORMAL HIGH (ref 11.5–15.5)
WBC: 12.7 K/uL — ABNORMAL HIGH (ref 4.0–10.5)

## 2014-04-29 LAB — COMPREHENSIVE METABOLIC PANEL
ALK PHOS: 266 U/L — AB (ref 39–117)
ALT: 69 U/L — AB (ref 0–35)
AST: 63 U/L — AB (ref 0–37)
Albumin: 3.5 g/dL (ref 3.5–5.2)
Anion gap: 13 (ref 5–15)
BUN: 18 mg/dL (ref 6–23)
CALCIUM: 9 mg/dL (ref 8.4–10.5)
CO2: 30 mEq/L (ref 19–32)
Chloride: 99 mEq/L (ref 96–112)
Creatinine, Ser: 1.16 mg/dL — ABNORMAL HIGH (ref 0.50–1.10)
GFR calc Af Amer: 58 mL/min — ABNORMAL LOW (ref >90)
GFR calc non Af Amer: 50 mL/min — ABNORMAL LOW (ref >90)
Glucose, Bld: 117 mg/dL — ABNORMAL HIGH (ref 70–99)
POTASSIUM: 3.7 meq/L (ref 3.7–5.3)
Sodium: 142 mEq/L (ref 137–147)
Total Bilirubin: 0.5 mg/dL (ref 0.3–1.2)
Total Protein: 8.5 g/dL — ABNORMAL HIGH (ref 6.0–8.3)

## 2014-04-29 LAB — TROPONIN I: Troponin I: <0.3 ng/mL

## 2014-04-29 LAB — PRO B NATRIURETIC PEPTIDE: Pro B Natriuretic peptide (BNP): 733.8 pg/mL — ABNORMAL HIGH (ref 0–125)

## 2014-04-29 MED ORDER — IPRATROPIUM BROMIDE 0.02 % IN SOLN
0.5000 mg | Freq: Once | RESPIRATORY_TRACT | Status: AC
  Administered 2014-04-29: 0.5 mg via RESPIRATORY_TRACT
  Filled 2014-04-29: qty 2.5

## 2014-04-29 MED ORDER — ALBUTEROL SULFATE (2.5 MG/3ML) 0.083% IN NEBU
5.0000 mg | INHALATION_SOLUTION | Freq: Once | RESPIRATORY_TRACT | Status: AC
  Administered 2014-04-29: 5 mg via RESPIRATORY_TRACT
  Filled 2014-04-29: qty 6

## 2014-04-29 MED ORDER — NITROGLYCERIN 2 % TD OINT
1.0000 [in_us] | TOPICAL_OINTMENT | Freq: Once | TRANSDERMAL | Status: AC
  Administered 2014-04-29: 1 [in_us] via TOPICAL
  Filled 2014-04-29: qty 30

## 2014-04-29 MED ORDER — FUROSEMIDE 10 MG/ML IJ SOLN
60.0000 mg | Freq: Once | INTRAMUSCULAR | Status: AC
  Administered 2014-04-29: 60 mg via INTRAVENOUS
  Filled 2014-04-29: qty 8

## 2014-04-29 NOTE — ED Provider Notes (Addendum)
CSN: [id number]     Arrival date & time 05/16/2014  2035 History   First MD Initiated Contact with Patient 04/27/2014 2114     Chief Complaint  Patient presents with  . Shortness of Breath     (Consider location/radiation/quality/duration/timing/severity/associated sxs/prior Treatment) HPI Patient reports she has a history of congestive heart failure. She relates she has been short of breath for the entire past month. She states today her breathing got worse. She states she feels short of breath all the time but he gets worse with exertion. She denies cough to me but when the nurse came in room she told her she's been coughing up clear sputum. She is unsure of fever. She has some mild upper chest discomfort that she describes as "indigestion" she states it has been present all week and got horrible today. She states it's a feeling that she needs to burp. She complains of swelling of her legs and abdomen. She was being followed at [facility] however she recently has been followed at [facility]. She states her last visit 2 weeks ago she was told she was having increasing fluid however they had her cut her fluid pills in half. She has an appointment to be re\re seen in 2 days. Patient is not on oxygen at home.  PCP [facility] Dr Meda Coffee  Past Medical History  Diagnosis Date  . Diabetes mellitus   . Morbid obesity   . Obstructive airway disease   . Hyperlipidemia   . Reactive airway disease   . CHF (congestive heart failure)   . DVT (deep venous thrombosis) 09/2014    LLE, Temporary IVC filter placed at that time (unable to be removed), on Coumadin 12/2004 - 05/2005  . Anemia, iron deficiency 04/02/2005    H/o iron deficiency anemia in setting of heavy vaginal bleeding in 2006.  Hgb 03/2014 12.7   . 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 Abdominal hysterectomy   11/2004    Laparotomy with TAH and BSO for heavy bleeding 2/2 Fibroids, confirmed benign by pathology  . Eye surgery      For cataract in R eye  . Appendectomy  11/2004    Removed during TAH   Family History  Problem Relation Age of Onset  . Diabetes Mother   . Hypertension Mother   . Cancer Father     bone cancer  . Heart disease Father   . Hypertension Father   . Depression Sister   . Diabetes Sister   . Diabetes Brother   . Kidney disease Brother     ESRD on HD   History  Substance Use Topics  . Smoking status: Never Smoker   . Smokeless tobacco: Never Used  . Alcohol Use: No     Comment: last drink months ago   Lives at home Lives alone No home oxygen   OB History   Grav Para Term Preterm Abortions TAB SAB Ect Mult Living                 Review of Systems  All other systems reviewed and are negative.     Allergies  Amoxicillin; Azor; and Thiazide-type diuretics  Home Medications   Prior to Admission medications   Medication Sig Start Date End Date Taking? Authorizing Provider  albuterol (PROVENTIL HFA;VENTOLIN HFA) 108 (90 BASE) MCG/ACT inhaler Inhale 1 puff into  the lungs every 4 (four) hours as needed. For shortness of breath. 08/10/11  Yes Samella Parr, NP  albuterol (PROVENTIL) (2.5 MG/3ML) 0.083% nebulizer solution Take 2.5 mg by nebulization every 6 (six) hours as needed for wheezing or shortness of breath (SOB).   Yes Historical Provider, MD  amLODipine (NORVASC) 10 MG tablet Take 10 mg by mouth daily.   Yes Historical Provider, MD  aspirin EC 81 MG tablet Take 81 mg by mouth daily.   Yes Historical Provider, MD  atorvastatin (LIPITOR) 40 MG tablet Take 40 mg by mouth daily.   Yes Historical Provider, MD  beclomethasone (QVAR) 40 MCG/ACT inhaler Inhale 2 puffs into the lungs 2 (two) times daily.   Yes Historical Provider, MD  beclomethasone (QVAR) 40 MCG/ACT inhaler Inhale 1 puff into the lungs 2 (two) times daily.   Yes Historical Provider, MD   furosemide (LASIX) 40 MG tablet Take 40 mg by mouth daily.   Yes Historical Provider, MD  ibuprofen (ADVIL,MOTRIN) 200 MG tablet Take 400 mg by mouth every 6 (six) hours as needed.   Yes Historical Provider, MD  insulin glargine (LANTUS) 100 UNIT/ML injection Inject 95 Units into the skin 2 (two) times daily.  08/10/11  Yes Samella Parr, NP  insulin regular (NOVOLIN R,HUMULIN R) 100 units/mL injection Inject 20 Units into the skin 3 (three) times daily before meals.   Yes Historical Provider, MD  metoprolol (LOPRESSOR) 50 MG tablet Take 50 mg by mouth 2 (two) times daily.   Yes Historical Provider, MD  triamterene-hydrochlorothiazide (MAXZIDE-25) 37.5-25 MG per tablet Take 1 tablet by mouth daily.   Yes Historical Provider, MD   BP 151/87  Pulse 86  Temp(Src) 99.6 F (37.6 C) (Oral)  Resp 36  SpO2 94% on oxygen/ 80%on RA  Vital signs normal except for tachypnea and hypoxia  Physical Exam  Nursing note and vitals reviewed. Constitutional: She is oriented to person, place, and time. She appears well-developed and well-nourished.  Non-toxic appearance. She does not appear ill. No distress.  Morbidly obese  HENT:  Head: Normocephalic and atraumatic.  Right Ear: External ear normal.  Left Ear: External ear normal.  Nose: Nose normal. No mucosal edema or rhinorrhea.  Mouth/Throat: Oropharynx is clear and moist and mucous membranes are normal. No dental abscesses or uvula swelling.  Eyes: Conjunctivae and EOM are normal. Pupils are equal, round, and reactive to light.  Neck: Normal range of motion and full passive range of motion without pain. Neck supple.  Cardiovascular: Normal rate, regular rhythm and normal heart sounds.  Exam reveals no gallop and no friction rub.   No murmur heard. I had difficulty hearing her heart tones due to the thickness of her chest wall  Pulmonary/Chest: Accessory muscle usage present. No respiratory distress. She has decreased breath sounds. She has no  wheezes. She has no rhonchi. She has no rales. She exhibits no tenderness and no crepitus.    Due to patient's chest cavity thickness I am unable to hear her lung sounds well in all. Area of her chest pain noted.   Abdominal: Soft. Normal appearance and bowel sounds are normal. She exhibits no distension. There is no tenderness. There is no rebound and no guarding.  Musculoskeletal: Normal range of motion. She exhibits no edema and no tenderness.  No pitting edema  Neurological: She is alert and oriented to person, place, and time. She has normal strength. No cranial nerve deficit.  Skin: Skin is warm, dry and intact. No  rash noted. No erythema. No pallor.  Psychiatric: She has a normal mood and affect. Her speech is normal and behavior is normal. Her mood appears not anxious.    ED Course  Procedures (including critical care time)  Medications  nitroGLYCERIN (NITROGLYN) 2 % ointment 1 inch (1 inch Topical Given 05/17/2014 2307)  furosemide (LASIX) injection 60 mg (60 mg Intravenous Given 05/11/2014 2307)  albuterol (PROVENTIL) (2.5 MG/3ML) 0.083% nebulizer solution 5 mg (5 mg Nebulization Given 05/09/2014 2153)  ipratropium (ATROVENT) nebulizer solution 0.5 mg (0.5 mg Nebulization Given 05/14/2014 2153)   2330 patient states she's feeling better. Her lungs were reexamined, I still cannot hear much air movement due to chest wall thickness. We discussed admission because her hypoxia. Patient states she wants to be admitted here. She is a patient at [facility] will discuss with them.   I talked to Dr. Raeford Razor, family practice resident at 2400. He states that if the patient requests to stay here she can.   00:12 I spoke to Dr Roel Cluck, who will admit to tele, team 8  00:18 I told the patient she would be admitted here.  00:31 Patient has now decided she wants to be admitted by the [facility] at [facility]  01:02 Dr Raeford Razor, accepts in transfer to [facility], tele, attending Dr Dub Mikes Review Results for orders placed  during the hospital encounter of 05/01/2014  CBC WITH DIFFERENTIAL      Result Value Ref Range   WBC 12.7 (*) 4.0 - 10.5 K/uL   RBC 4.27  3.87 - 5.11 MIL/uL   Hemoglobin 11.5 (*) 12.0 - 15.0 g/dL   HCT 38.1  36.0 - 46.0 %   MCV 89.2  78.0 - 100.0 fL   MCH 26.9  26.0 - 34.0 pg   MCHC 30.2  30.0 - 36.0 g/dL   RDW 15.6 (*) 11.5 - 15.5 %   Platelets 267  150 - 400 K/uL   Neutrophils Relative % 72  43 - 77 %   Neutro Abs 9.2 (*) 1.7 - 7.7 K/uL   Lymphocytes Relative 19  12 - 46 %   Lymphs Abs 2.4  0.7 - 4.0 K/uL   Monocytes Relative 6  3 - 12 %   Monocytes Absolute 0.8  0.1 - 1.0 K/uL   Eosinophils Relative 3  0 - 5 %   Eosinophils Absolute 0.3  0.0 - 0.7 K/uL   Basophils Relative 0  0 - 1 %   Basophils Absolute 0.0  0.0 - 0.1 K/uL  COMPREHENSIVE METABOLIC PANEL      Result Value Ref Range   Sodium 142  137 - 147 mEq/L   Potassium 3.7  3.7 - 5.3 mEq/L   Chloride 99  96 - 112 mEq/L   CO2 30  19 - 32 mEq/L   Glucose, Bld 117 (*) 70 - 99 mg/dL   BUN 18  6 - 23 mg/dL   Creatinine, Ser 1.16 (*) 0.50 - 1.10 mg/dL   Calcium 9.0  8.4 - 10.5 mg/dL   Total Protein 8.5 (*) 6.0 - 8.3 g/dL   Albumin 3.5  3.5 - 5.2 g/dL   AST 63 (*) 0 - 37 U/L   ALT 69 (*) 0 - 35 U/L   Alkaline Phosphatase 266 (*) 39 - 117 U/L   Total Bilirubin 0.5  0.3 - 1.2 mg/dL   GFR calc non Af Amer 50 (*) >90 mL/min   GFR calc Af Amer 58 (*) >90 mL/min  Anion gap 13  5 - 15  PRO B NATRIURETIC PEPTIDE      Result Value Ref Range   Pro B Natriuretic peptide (BNP) 733.8 (*) 0 - 125 pg/mL  TROPONIN I      Result Value Ref Range   Troponin I <0.30  <0.30 ng/mL    Laboratory interpretation all normal except  Mildly elevated BNP, renal insufficiency, mild anemia, leukocytosis    Imaging Review Dg Chest Portable 1 View  04/28/2014   CLINICAL DATA:  Shortness of breath.  EXAM: PORTABLE CHEST - 1 VIEW  COMPARISON:  04/05/2014  FINDINGS: Shallow inspiration. Cardiac enlargement with pulmonary vascular congestion. No  definite edema or consolidation. No blunting of costophrenic angles. No pneumothorax.  IMPRESSION: Cardiac enlargement with pulmonary vascular congestion. No edema or consolidation.   Electronically Signed   By: Lucienne Capers M.D.   On: 05/13/2014 22:02     Dg Chest 2 View  04/05/2014   CLINICAL DATA:  Several week history of shortness of breath; history of reactive airway disease and morbid obesity and CHF.  IMPRESSION: There is no acute cardiopulmonary abnormality. Low-grade compensated CHF may be present.   Electronically Signed   By: David  Martinique   On: 04/05/2014 14:11    EKG Interpretation   Date/Time:  Sunday April 29 2014 22:12:07 EDT Ventricular Rate:  84 PR Interval:  148 QRS Duration: 86 QT Interval:  372 QTC Calculation: 440 R Axis:   9 Text Interpretation:  Sinus rhythm Nonspecific T abnormalities, lateral  leads Since last tracing 02 Apr 2014 T wave inversion no longer evident in  Lateral leads Confirmed by Shina Wass  MD-I, Jolin Benavides ([id number]) on 04/25/2014 10:40:13  PM      MDM   Final diagnoses:  Congestive heart failure, unspecified congestive heart failure chronicity, unspecified congestive heart failure type  Hypoxia     Plan transfer to [facility] for admission   Rolland Porter, MD, Alanson Aly, MD 04/30/14 Greer Pickerel  Janice Norrie, MD 04/30/14 [id number]

## 2014-04-29 NOTE — ED Notes (Signed)
Bed: WA14 Expected date:  Expected time:  Means of arrival:  Comments: Hold for triage 4 

## 2014-04-29 NOTE — ED Notes (Signed)
Pt arrived to the ED with a complaint of shortness of breath.  Pt has a history of CHF and was put on a fluid pill.  Pt had relief after taking medication but medication was discontinued.  Pt has since has had bilateral pedal and leg edema.  Pt states that the shortness of breath.  Pt states today upon awaking she had difficulty breathing.  Pt is not on oxygen at the house.  Pt in triage had a oxygen saturation of 80 and it rose into the low 90's with 4 L O2

## 2014-04-30 ENCOUNTER — Encounter (HOSPITAL_COMMUNITY): Payer: Self-pay | Admitting: Family Medicine

## 2014-04-30 DIAGNOSIS — E785 Hyperlipidemia, unspecified: Secondary | ICD-10-CM | POA: Diagnosis present

## 2014-04-30 DIAGNOSIS — K761 Chronic passive congestion of liver: Secondary | ICD-10-CM | POA: Diagnosis present

## 2014-04-30 DIAGNOSIS — J9620 Acute and chronic respiratory failure, unspecified whether with hypoxia or hypercapnia: Secondary | ICD-10-CM | POA: Diagnosis present

## 2014-04-30 DIAGNOSIS — I469 Cardiac arrest, cause unspecified: Secondary | ICD-10-CM | POA: Diagnosis not present

## 2014-04-30 DIAGNOSIS — Z833 Family history of diabetes mellitus: Secondary | ICD-10-CM | POA: Diagnosis not present

## 2014-04-30 DIAGNOSIS — Z841 Family history of disorders of kidney and ureter: Secondary | ICD-10-CM | POA: Diagnosis not present

## 2014-04-30 DIAGNOSIS — E119 Type 2 diabetes mellitus without complications: Secondary | ICD-10-CM | POA: Diagnosis present

## 2014-04-30 DIAGNOSIS — I5033 Acute on chronic diastolic (congestive) heart failure: Secondary | ICD-10-CM | POA: Diagnosis present

## 2014-04-30 DIAGNOSIS — Z86718 Personal history of other venous thrombosis and embolism: Secondary | ICD-10-CM | POA: Diagnosis not present

## 2014-04-30 DIAGNOSIS — Z6841 Body Mass Index (BMI) 40.0 and over, adult: Secondary | ICD-10-CM | POA: Diagnosis not present

## 2014-04-30 DIAGNOSIS — Z888 Allergy status to other drugs, medicaments and biological substances status: Secondary | ICD-10-CM | POA: Diagnosis not present

## 2014-04-30 DIAGNOSIS — R0902 Hypoxemia: Secondary | ICD-10-CM | POA: Diagnosis present

## 2014-04-30 DIAGNOSIS — A419 Sepsis, unspecified organism: Secondary | ICD-10-CM | POA: Diagnosis present

## 2014-04-30 DIAGNOSIS — Z818 Family history of other mental and behavioral disorders: Secondary | ICD-10-CM | POA: Diagnosis not present

## 2014-04-30 DIAGNOSIS — Z808 Family history of malignant neoplasm of other organs or systems: Secondary | ICD-10-CM | POA: Diagnosis not present

## 2014-04-30 DIAGNOSIS — G4733 Obstructive sleep apnea (adult) (pediatric): Secondary | ICD-10-CM | POA: Diagnosis present

## 2014-04-30 DIAGNOSIS — I509 Heart failure, unspecified: Secondary | ICD-10-CM | POA: Diagnosis present

## 2014-04-30 DIAGNOSIS — Z8249 Family history of ischemic heart disease and other diseases of the circulatory system: Secondary | ICD-10-CM | POA: Diagnosis not present

## 2014-04-30 DIAGNOSIS — J45909 Unspecified asthma, uncomplicated: Secondary | ICD-10-CM | POA: Diagnosis present

## 2014-04-30 DIAGNOSIS — N183 Chronic kidney disease, stage 3 unspecified: Secondary | ICD-10-CM | POA: Diagnosis present

## 2014-04-30 DIAGNOSIS — I129 Hypertensive chronic kidney disease with stage 1 through stage 4 chronic kidney disease, or unspecified chronic kidney disease: Secondary | ICD-10-CM | POA: Diagnosis present

## 2014-04-30 DIAGNOSIS — J189 Pneumonia, unspecified organism: Secondary | ICD-10-CM | POA: Diagnosis present

## 2014-04-30 DIAGNOSIS — Z881 Allergy status to other antibiotic agents status: Secondary | ICD-10-CM | POA: Diagnosis not present

## 2014-04-30 DIAGNOSIS — Z602 Problems related to living alone: Secondary | ICD-10-CM | POA: Diagnosis not present

## 2014-04-30 LAB — GLUCOSE, CAPILLARY
GLUCOSE-CAPILLARY: 154 mg/dL — AB (ref 70–99)
Glucose-Capillary: 81 mg/dL (ref 70–99)
Glucose-Capillary: 106 mg/dL — ABNORMAL HIGH (ref 70–99)
Glucose-Capillary: 332 mg/dL — ABNORMAL HIGH (ref 70–99)

## 2014-04-30 LAB — CBC
HCT: 36.6 % (ref 36.0–46.0)
Hemoglobin: 11.5 g/dL — ABNORMAL LOW (ref 12.0–15.0)
MCH: 27.4 pg (ref 26.0–34.0)
MCHC: 31.4 g/dL (ref 30.0–36.0)
MCV: 87.4 fL (ref 78.0–100.0)
PLATELETS: 278 10*3/uL (ref 150–400)
RBC: 4.19 MIL/uL (ref 3.87–5.11)
RDW: 15.6 % — ABNORMAL HIGH (ref 11.5–15.5)
WBC: 11.8 10*3/uL — ABNORMAL HIGH (ref 4.0–10.5)

## 2014-04-30 LAB — FERRITIN: Ferritin: 135 ng/mL (ref 10–291)

## 2014-04-30 LAB — PROTIME-INR
INR: 1.1 (ref 0.00–1.49)
Prothrombin Time: 14.2 seconds (ref 11.6–15.2)

## 2014-04-30 LAB — COMPREHENSIVE METABOLIC PANEL
ALT: 61 U/L — ABNORMAL HIGH (ref 0–35)
AST: 45 U/L — ABNORMAL HIGH (ref 0–37)
Albumin: 3.4 g/dL — ABNORMAL LOW (ref 3.5–5.2)
Alkaline Phosphatase: 245 U/L — ABNORMAL HIGH (ref 39–117)
Anion gap: 13 (ref 5–15)
BUN: 16 mg/dL (ref 6–23)
CALCIUM: 8.7 mg/dL (ref 8.4–10.5)
CO2: 29 meq/L (ref 19–32)
CREATININE: 1.12 mg/dL — AB (ref 0.50–1.10)
Chloride: 98 mEq/L (ref 96–112)
GFR calc non Af Amer: 52 mL/min — ABNORMAL LOW (ref >90)
GFR, EST AFRICAN AMERICAN: 61 mL/min — AB (ref >90)
GLUCOSE: 88 mg/dL (ref 70–99)
Potassium: 3.4 mEq/L — ABNORMAL LOW (ref 3.7–5.3)
SODIUM: 140 meq/L (ref 137–147)
TOTAL PROTEIN: 8.1 g/dL (ref 6.0–8.3)
Total Bilirubin: 0.6 mg/dL (ref 0.3–1.2)

## 2014-04-30 LAB — TSH: TSH: 3.58 u[IU]/mL (ref 0.350–4.500)

## 2014-04-30 LAB — TROPONIN I: Troponin I: <0.3 ng/mL (ref <0.30)

## 2014-04-30 LAB — IRON AND TIBC
Iron: 19 ug/dL — ABNORMAL LOW (ref 42–135)
SATURATION RATIOS: 5 % — AB (ref 20–55)
TIBC: 359 ug/dL (ref 250–470)
UIBC: 340 ug/dL (ref 125–400)

## 2014-04-30 LAB — HEPATITIS C ANTIBODY: HCV Ab: NEGATIVE

## 2014-04-30 MED ORDER — FLUTICASONE PROPIONATE HFA 44 MCG/ACT IN AERO
1.0000 | INHALATION_SPRAY | Freq: Two times a day (BID) | RESPIRATORY_TRACT | Status: DC
  Administered 2014-04-30 – 2014-05-03 (×7): 1 via RESPIRATORY_TRACT
  Filled 2014-04-30 (×4): qty 10.6

## 2014-04-30 MED ORDER — INSULIN GLARGINE 100 UNIT/ML ~~LOC~~ SOLN
30.0000 [IU] | Freq: Every day | SUBCUTANEOUS | Status: DC
  Filled 2014-04-30: qty 0.3

## 2014-04-30 MED ORDER — ASPIRIN EC 81 MG PO TBEC
81.0000 mg | DELAYED_RELEASE_TABLET | Freq: Every day | ORAL | Status: DC
  Administered 2014-04-30 – 2014-05-03 (×4): 81 mg via ORAL
  Filled 2014-04-30 (×5): qty 1

## 2014-04-30 MED ORDER — ALBUTEROL SULFATE (2.5 MG/3ML) 0.083% IN NEBU
2.5000 mg | INHALATION_SOLUTION | Freq: Four times a day (QID) | RESPIRATORY_TRACT | Status: DC | PRN
  Administered 2014-05-02 – 2014-05-03 (×2): 2.5 mg via RESPIRATORY_TRACT
  Filled 2014-04-30 (×2): qty 3

## 2014-04-30 MED ORDER — FUROSEMIDE 10 MG/ML IJ SOLN
60.0000 mg | Freq: Once | INTRAMUSCULAR | Status: AC
  Administered 2014-04-30: 60 mg via INTRAVENOUS
  Filled 2014-04-30: qty 6

## 2014-04-30 MED ORDER — SODIUM CHLORIDE 0.45 % IV SOLN
INTRAVENOUS | Status: DC
  Administered 2014-04-30: 05:00:00 via INTRAVENOUS

## 2014-04-30 MED ORDER — HYDROCHLOROTHIAZIDE 12.5 MG PO CAPS
12.5000 mg | ORAL_CAPSULE | Freq: Every day | ORAL | Status: DC
  Administered 2014-04-30 – 2014-05-03 (×4): 12.5 mg via ORAL
  Filled 2014-04-30 (×5): qty 1

## 2014-04-30 MED ORDER — CETYLPYRIDINIUM CHLORIDE 0.05 % MT LIQD
7.0000 mL | Freq: Two times a day (BID) | OROMUCOSAL | Status: DC
  Administered 2014-04-30 – 2014-05-03 (×7): 7 mL via OROMUCOSAL

## 2014-04-30 MED ORDER — FUROSEMIDE 10 MG/ML IJ SOLN
60.0000 mg | Freq: Two times a day (BID) | INTRAMUSCULAR | Status: DC
  Administered 2014-04-30 – 2014-05-01 (×3): 60 mg via INTRAVENOUS
  Filled 2014-04-30 (×6): qty 6

## 2014-04-30 MED ORDER — ONDANSETRON HCL 4 MG/2ML IJ SOLN: 4.0000 mg | Freq: Four times a day (QID) | INTRAMUSCULAR | Status: DC | PRN

## 2014-04-30 MED ORDER — INSULIN ASPART 100 UNIT/ML ~~LOC~~ SOLN
0.0000 [IU] | Freq: Every day | SUBCUTANEOUS | Status: DC
  Administered 2014-04-30: 4 [IU] via SUBCUTANEOUS
  Administered 2014-05-01 – 2014-05-03 (×3): 3 [IU] via SUBCUTANEOUS

## 2014-04-30 MED ORDER — ALBUTEROL SULFATE HFA 108 (90 BASE) MCG/ACT IN AERS: 1.0000 | INHALATION_SPRAY | RESPIRATORY_TRACT | Status: DC | PRN

## 2014-04-30 MED ORDER — INSULIN ASPART 100 UNIT/ML ~~LOC~~ SOLN
0.0000 [IU] | Freq: Three times a day (TID) | SUBCUTANEOUS | Status: DC
  Administered 2014-04-30: 3 [IU] via SUBCUTANEOUS
  Administered 2014-05-01: 5 [IU] via SUBCUTANEOUS
  Administered 2014-05-01: 3 [IU] via SUBCUTANEOUS
  Administered 2014-05-01 – 2014-05-02 (×2): 5 [IU] via SUBCUTANEOUS
  Administered 2014-05-02: 3 [IU] via SUBCUTANEOUS
  Administered 2014-05-02: 8 [IU] via SUBCUTANEOUS
  Administered 2014-05-03: 11 [IU] via SUBCUTANEOUS
  Administered 2014-05-03: 8 [IU] via SUBCUTANEOUS
  Administered 2014-05-03: 5 [IU] via SUBCUTANEOUS

## 2014-04-30 MED ORDER — POLYETHYLENE GLYCOL 3350 17 G PO PACK
17.0000 g | PACK | Freq: Every day | ORAL | Status: DC | PRN
  Filled 2014-04-30: qty 1

## 2014-04-30 MED ORDER — METOPROLOL TARTRATE 50 MG PO TABS
50.0000 mg | ORAL_TABLET | Freq: Two times a day (BID) | ORAL | Status: DC
  Administered 2014-04-30 – 2014-05-03 (×8): 50 mg via ORAL
  Filled 2014-04-30 (×10): qty 1

## 2014-04-30 MED ORDER — TRIAMTERENE-HCTZ 37.5-25 MG PO TABS
1.0000 | ORAL_TABLET | Freq: Every day | ORAL | Status: DC
  Filled 2014-04-30: qty 1

## 2014-04-30 MED ORDER — HEPARIN SODIUM (PORCINE) 5000 UNIT/ML IJ SOLN
5000.0000 [IU] | Freq: Three times a day (TID) | INTRAMUSCULAR | Status: DC
  Administered 2014-04-30 – 2014-05-03 (×12): 5000 [IU] via SUBCUTANEOUS
  Filled 2014-04-30 (×16): qty 1

## 2014-04-30 MED ORDER — POTASSIUM CHLORIDE CRYS ER 20 MEQ PO TBCR
20.0000 meq | EXTENDED_RELEASE_TABLET | Freq: Two times a day (BID) | ORAL | Status: DC
  Administered 2014-04-30 – 2014-05-03 (×8): 20 meq via ORAL
  Filled 2014-04-30 (×10): qty 1

## 2014-04-30 MED ORDER — ONDANSETRON HCL 4 MG PO TABS: 4.0000 mg | ORAL_TABLET | Freq: Four times a day (QID) | ORAL | Status: DC | PRN

## 2014-04-30 MED ORDER — ACETAMINOPHEN 325 MG PO TABS
650.0000 mg | ORAL_TABLET | Freq: Four times a day (QID) | ORAL | Status: DC | PRN
  Administered 2014-05-02 – 2014-05-03 (×3): 650 mg via ORAL
  Filled 2014-04-30 (×3): qty 2

## 2014-04-30 MED ORDER — ATORVASTATIN CALCIUM 40 MG PO TABS
40.0000 mg | ORAL_TABLET | Freq: Every day | ORAL | Status: DC
  Administered 2014-04-30 – 2014-05-03 (×4): 40 mg via ORAL
  Filled 2014-04-30 (×6): qty 1

## 2014-04-30 MED ORDER — ACETAMINOPHEN 650 MG RE SUPP: 650.0000 mg | Freq: Four times a day (QID) | RECTAL | Status: DC | PRN

## 2014-04-30 MED ORDER — AMLODIPINE BESYLATE 10 MG PO TABS
10.0000 mg | ORAL_TABLET | Freq: Every day | ORAL | Status: DC
  Administered 2014-04-30: 10 mg via ORAL
  Filled 2014-04-30 (×2): qty 1

## 2014-04-30 MED ORDER — LISINOPRIL 5 MG PO TABS
5.0000 mg | ORAL_TABLET | Freq: Every day | ORAL | Status: DC
  Administered 2014-04-30 – 2014-05-01 (×2): 5 mg via ORAL
  Filled 2014-04-30 (×3): qty 1

## 2014-04-30 NOTE — ED Notes (Signed)
Pt was taken to the bathroom upon return to the room pt's O2 sats were in the low 70's.  [place] at 3L placed on pt and sats increased to 93%.

## 2014-04-30 NOTE — H&P (Signed)
FMTS Attending Note  I personally saw and evaluated the patient. The plan of care was discussed with the resident team. I agree with the assessment and plan as documented by the resident.   58 year old female with past medical history of diastolic heart failure, restrictive lung disease, hyperlipidemia, OSA, hypertension and type 2 diabetes presents with increased work or breathing and lower extremity edema, patient was recently seen in office on 04/09/2014 and transitioned to Lasix 20 mg daily, since that time she has noticed increasing dyspnea, orthopnea, and lower extremity edema. No associated chest pain. Please refer to resident note for additional history of present illness.  Vitals: Reviewed General: Pleasant African American female, no acute distress, wearing oxygen by nasal cannula HEENT: Normocephalic, pupils equal in size bilaterally, no scleral icterus, moist mucous members, neck was supple, no anterior posterior cervical lymphadenopathy Cardiac: Regular rate and rhythm, S1 and S2 present, no murmurs, no heaves or thrills Respiratory: Decreased breath sounds bilaterally, no wheezes or crackles appreciated Abdomen: Obese, soft, nontender, difficult to evaluate for organomegaly secondary to severe obesity Extremities: 2+ pitting edema to the knee bilaterally, 2+ radial pulses bilaterally  Reviewed lab work  Assessment and plan: 58 year old female admitted with acute dyspnea 1. Acute on chronic respiratory failure secondary to diastolic heart failure exacerbation - continue IV diuresis, monitor weights, monitor creatinine function 2. Hypertension- currently controlled, patient not on ACE inhibitor therefore agree with lisinopril in the setting of CHF, continue home beta blocker, amlodipine, and HCTZ. Patient currently on Maxzide (combination of triamterene and HCTZ), patient will likely be discharged on home Maxide 3. Elevated liver enzymes-suspect secondary to hepatic congestion, repeat  CMP 4. Diabetes- agree with insulin regimen as outlined in the resident note 5. Hyperlipidemia-continue home statin 6. Morbid obesity-consider outpatient nutrition consult 7. Objective sleep apnea-continue home CPAP  Dossie Arbour M.D.

## 2014-04-30 NOTE — Progress Notes (Signed)
Utilization Review Completed.[name] T9/02/2014  

## 2014-04-30 NOTE — H&P (Signed)
[facility] Admission History and Physical Service Pager: [phone]  Patient name: [name] Medical record number: [id number] Date of birth: [birth year] Age: 58 y.o. Gender: female  Primary Care Provider: Lavon Paganini, MD Consultants: none Code Status: Full   Chief Complaint: CHF exacerbation   Assessment and Plan: [name] is a 58 y.o. female presenting with CHF exacerbation. PMH is significant for diastolic CHF, restrictive lung disease, HLD, OSA, HTN, and T2DM.   #CHF exacerbation, Acute on chronic diastolic CHF: Patient presented to the ED and medicine of hypoxemia that was corrected with oxygen. Patient being worked up for worsening heart failure as outpatient and started on Lasix about a month ago. Has been saying a decrease in her weight over this time. Echo performed last week showing ejection fraction 65-70% with grade 2 diastolic dysfunction. Her baseline weight is 290 pounds and she has recently seen a 10 pound weight loss since starting the Lasix. ProBNP is elevated suggesting a CHF exacerbation. Chest x-ray not showing any signs of infection or fluid overload. No crackles or on exam a +2 pitting edema in the bilateral lower extremities. Troponin has been negative up to this point. - Admitted to telemetry, Dr. Ree Kida attending  - Lasix 60 mg IV daily - Metoprolol 50 mg twice a day - Strict ins and outs - Daily weights - Cycle troponins x1 - CBC, BMP, iron and TIBC, Ferritin  - Aspirin 81 mg - O2 as needed to keep oxygen above 90%  #Elevated transaminases: Patient reports no history of any alcohol use. These were normal roughly about a month ago. - Hepatitis C antibody pending - Consider complete abdominal ultrasound - CMP  #Hypertension: Patient uncontrolled high blood pressure. Reported history of stopping lisinopril due to changing in kidney function.  - Continue amlodipine - Stopped Maxide, started lisinopril 10 mg -  Started hydrochlorothiazide 12.5 mg  #Type 2 diabetes: Last hemoglobin A1c of 8.3. Home dose of injecting 95 units of Lantus 2 times daily with also meal coverage of 20 units 3 times a day. - Lantus 30 units daily - CBG monitoring with meals and each bedtime - Moderate sliding scale insulin coverage - Each bedtime coverage  #Hyperlipidemia: The lipid panel conducted on 8/10 and already on a statin - Continue home statin  #Asthma, restrictive lung disease: Questionable history of asthma. PFTs showed restrictive picture. No wheezing on exam and no reported albuterol use. - Continue home meds  #Morbid obesity: BMI of 56.6. Baseline of 290 pounds.  #OSA: Reports using his CPAP at night - CPAP when necessary each bedtime  FEN/GI: carb modified diet/ 1/2 NS 75 mL/hr  Prophylaxis: heparin subQ  Disposition: admitted to family medicine teaching service for CHF exacerbation   History of Present Illness: [name] is a 58 y.o. female presenting with CHF exacerbation. Patient has started being worked up for her heart failure is outpatient recently. This is exacerbation of the symptoms. She has been feeling shortness of breath and gaining weight over the past month. She's also noticed a having swelling in both of her legs. She was recently started on 40 mg of Lasix but this was transitioned to 20 mg on 8/17. At the checkup on 817 she had lost 10 pounds. She does not endorse any orthopnea or PND. She denies any chest pain, abdominal pain, or any changes with bowel movements or urination. She denies any nausea, vomiting, or fevers. She has had no sick contacts. She does not use any oxygen at  home. She has been compliant with her Lasix dose. Patient was scheduled to see the cardiologist this Friday.  Patient was seen at [facility] and was transported to [facility].  Review Of Systems: Per HPI with the following additions: See HPI  Otherwise 12 point review of systems was performed and was  unremarkable.  Patient Active Problem List   Diagnosis Date Noted  . Acute on chronic diastolic CHF (congestive heart failure), NYHA class 2 04/30/2014  . Hypoxia 04/30/2014  . CHF (congestive heart failure) 04/30/2014  . CHF exacerbation 04/30/2014  . Chronic diastolic heart failure 58/04/9832  . Other and unspecified hyperlipidemia 04/02/2014  . Type II or unspecified type diabetes mellitus without mention of complication, uncontrolled 04/02/2014  . Essential hypertension 04/02/2014  . Obstructive sleep apnea 04/02/2014  . Morbid obesity   . Restrictive lung disease    Past Medical History: Past Medical History  Diagnosis Date  . Diabetes mellitus   . Morbid obesity   . Obstructive airway disease   . Hyperlipidemia   . Reactive airway disease   . CHF (congestive heart failure)   . DVT (deep venous thrombosis) 09/2014    LLE, Temporary IVC filter placed at that time (unable to be removed), on Coumadin 12/2004 - 05/2005  . Anemia, iron deficiency 04/02/2005    H/o iron deficiency anemia in setting of heavy vaginal bleeding in 2006.  Hgb 03/2014 12.7   . 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 Abdominal hysterectomy  11/2004    Laparotomy with TAH and BSO for heavy bleeding 2/2 Fibroids, confirmed benign by pathology  . Eye surgery      For cataract in R eye  . Appendectomy  11/2004    Removed during TAH   Social History: History  Substance Use Topics  . Smoking status: Never Smoker   . Smokeless tobacco: Never Used  . Alcohol Use: No     Comment: last drink months ago   Additional social history: never smoker, doesn't drink alcohol, and no illicit drugs.  Please also refer to relevant sections of EMR.  Family History: Family History  Problem Relation Age of Onset  . Diabetes Mother   . Hypertension Mother   . Cancer Father     bone cancer   . Heart disease Father   . Hypertension Father   . Depression Sister   . Diabetes Sister   . Diabetes Brother   . Kidney disease Brother     ESRD on HD   Allergies and Medications: Allergies  Allergen Reactions  . Amoxicillin Other (See Comments)    unknown  . Azor [Amlodipine-Olmesartan] Swelling  . Thiazide-Type Diuretics Other (See Comments)    Renal issues   No current facility-administered medications on file prior to encounter.   Current Outpatient Prescriptions on File Prior to Encounter  Medication Sig Dispense Refill  . albuterol (PROVENTIL HFA;VENTOLIN HFA) 108 (90 BASE) MCG/ACT inhaler Inhale 1 puff into the lungs every 4 (four) hours as needed. For shortness of breath.  1 Inhaler  0  . albuterol (PROVENTIL) (2.5 MG/3ML) 0.083% nebulizer solution Take 2.5 mg by nebulization every 6 (six) hours as needed for wheezing or shortness of breath (SOB).      Marland Kitchen atorvastatin (LIPITOR) 40 MG tablet Take 40 mg by mouth daily.      . beclomethasone (QVAR) 40 MCG/ACT inhaler  Inhale 2 puffs into the lungs 2 (two) times daily.      . insulin glargine (LANTUS) 100 UNIT/ML injection Inject 95 Units into the skin 2 (two) times daily.       . insulin regular (NOVOLIN R,HUMULIN R) 100 units/mL injection Inject 20 Units into the skin 3 (three) times daily before meals.      . triamterene-hydrochlorothiazide (MAXZIDE-25) 37.5-25 MG per tablet Take 1 tablet by mouth daily.        Objective: BP 153/63  Pulse 87  Temp(Src) 98.8 F (37.1 C) (Oral)  Resp 24  Ht 5\' 3"  (1.6 m)  Wt 319 lb 0.1 oz (144.7 kg)  BMI 56.52 kg/m2  SpO2 95% Exam: General: No acute distress, alert, morbidly obese extremities female HEENT: [place]/AT, oropharynx clear, extraocular movements intact Cardiovascular: RRR, S1S2, no murmurs rubs or gallops Respiratory: Clear to auscultation bilaterally, some extra effort with breathing, placed on nasal cannula 3 L, no wheezing, crackles, rhonchi Abdomen: Protuberant abdomen,  soft, nontender nondistended, positive bowel sounds Extremities: +2 pitting edema in bilateral lower extremities to the knees, normal grip strength, 5 out of 5 strength in upper and lower extremity bilaterally Skin: No rashes Neuro: No gross deficits  Labs and Imaging: CBC BMET   Recent Labs Lab 04/28/2014 2200  WBC 12.7*  HGB 11.5*  HCT 38.1  PLT 267    Recent Labs Lab 05/10/2014 2200  NA 142  K 3.7  CL 99  CO2 30  BUN 18  CREATININE 1.16*  GLUCOSE 117*  CALCIUM 9.0     Hepatic Function Panel     Component Value Date/Time   PROT 8.5* 04/30/2014 2200   ALBUMIN 3.5 05/21/2014 2200   AST 63* 05/03/2014 2200   ALT 69* 05/10/2014 2200   ALKPHOS 266* 04/27/2014 2200   BILITOT 0.5 04/25/2014 2200    CXR: IMPRESSION:  Cardiac enlargement with pulmonary vascular congestion. No edema or  consolidation.  EKG: nonspecific t wave changes which are not new .   Rosemarie Ax, MD 04/30/2014, 3:00 AM PGY-2, [place] [facility] pager: [phone], text pages welcome

## 2014-05-01 ENCOUNTER — Ambulatory Visit: Payer: Medicaid Other | Admitting: Family Medicine

## 2014-05-01 ENCOUNTER — Encounter (HOSPITAL_COMMUNITY): Payer: Self-pay | Admitting: General Practice

## 2014-05-01 DIAGNOSIS — E1165 Type 2 diabetes mellitus with hyperglycemia: Secondary | ICD-10-CM

## 2014-05-01 DIAGNOSIS — J984 Other disorders of lung: Secondary | ICD-10-CM

## 2014-05-01 DIAGNOSIS — I10 Essential (primary) hypertension: Secondary | ICD-10-CM

## 2014-05-01 DIAGNOSIS — I5033 Acute on chronic diastolic (congestive) heart failure: Principal | ICD-10-CM

## 2014-05-01 DIAGNOSIS — G4733 Obstructive sleep apnea (adult) (pediatric): Secondary | ICD-10-CM

## 2014-05-01 DIAGNOSIS — IMO0001 Reserved for inherently not codable concepts without codable children: Secondary | ICD-10-CM

## 2014-05-01 DIAGNOSIS — I509 Heart failure, unspecified: Secondary | ICD-10-CM

## 2014-05-01 LAB — CBC
HCT: 38.4 % (ref 36.0–46.0)
Hemoglobin: 11.7 g/dL — ABNORMAL LOW (ref 12.0–15.0)
MCH: 26.9 pg (ref 26.0–34.0)
MCHC: 30.5 g/dL (ref 30.0–36.0)
MCV: 88.3 fL (ref 78.0–100.0)
PLATELETS: 303 10*3/uL (ref 150–400)
RBC: 4.35 MIL/uL (ref 3.87–5.11)
RDW: 15.6 % — ABNORMAL HIGH (ref 11.5–15.5)
WBC: 11.4 10*3/uL — ABNORMAL HIGH (ref 4.0–10.5)

## 2014-05-01 LAB — COMPREHENSIVE METABOLIC PANEL
ALT: 45 U/L — ABNORMAL HIGH (ref 0–35)
AST: 24 U/L (ref 0–37)
Albumin: 3.3 g/dL — ABNORMAL LOW (ref 3.5–5.2)
Alkaline Phosphatase: 239 U/L — ABNORMAL HIGH (ref 39–117)
Anion gap: 13 (ref 5–15)
BUN: 21 mg/dL (ref 6–23)
CO2: 31 meq/L (ref 19–32)
Calcium: 8.8 mg/dL (ref 8.4–10.5)
Chloride: 95 mEq/L — ABNORMAL LOW (ref 96–112)
Creatinine, Ser: 1.19 mg/dL — ABNORMAL HIGH (ref 0.50–1.10)
GFR calc Af Amer: 56 mL/min — ABNORMAL LOW (ref >90)
GFR, EST NON AFRICAN AMERICAN: 49 mL/min — AB (ref >90)
Glucose, Bld: 252 mg/dL — ABNORMAL HIGH (ref 70–99)
POTASSIUM: 3.7 meq/L (ref 3.7–5.3)
SODIUM: 139 meq/L (ref 137–147)
TOTAL PROTEIN: 8.5 g/dL — AB (ref 6.0–8.3)
Total Bilirubin: 0.7 mg/dL (ref 0.3–1.2)

## 2014-05-01 LAB — GLUCOSE, CAPILLARY
GLUCOSE-CAPILLARY: 234 mg/dL — AB (ref 70–99)
GLUCOSE-CAPILLARY: 193 mg/dL — AB (ref 70–99)
GLUCOSE-CAPILLARY: 247 mg/dL — AB (ref 70–99)
GLUCOSE-CAPILLARY: 256 mg/dL — AB (ref 70–99)

## 2014-05-01 MED ORDER — AMLODIPINE BESYLATE 5 MG PO TABS: 5.0000 mg | ORAL_TABLET | Freq: Every day | ORAL | Status: DC

## 2014-05-01 MED ORDER — SALINE SPRAY 0.65 % NA SOLN
1.0000 | NASAL | Status: DC | PRN
  Filled 2014-05-01: qty 44

## 2014-05-01 MED ORDER — INSULIN GLARGINE 100 UNIT/ML ~~LOC~~ SOLN
20.0000 [IU] | Freq: Every day | SUBCUTANEOUS | Status: DC
  Administered 2014-05-01: 20 [IU] via SUBCUTANEOUS
  Filled 2014-05-01 (×2): qty 0.2

## 2014-05-01 NOTE — Plan of Care (Signed)
Problem: Phase I Progression Outcomes Goal: EF % per last Echo/documented,Core Reminder form on chart Outcome: Completed/Met Date Met:  05/01/14 Ef=65%-70% , core reminder .04/06/2014

## 2014-05-01 NOTE — Progress Notes (Signed)
PCP note:  I came to visit with and check on [name], one of my primary patients, today.  She is feeling improved since admission and is several pounds of fluid down since admission.  She was seen in clinic about a month ago as a new patient and found to be in a CHF exacerbation.  She was started on Lasix at that time and scheduled for an Echo and Cards eval.  [name] is looking forward to discharge in the next few days and already has an appointment with Cardiology on Friday that she would like to attend.  I appreciate the excellent care that the [facility] team is providing for this pleasant patient of mine, and look forward to seeing her as an outpatient for follow-up.  Lavon Paganini, MD, MPH PGY-1,  [place] [facility] 05/01/2014  11:50 AM FPTS inpatient team pager: [phone] (please page for any questions regarding care)

## 2014-05-01 NOTE — Progress Notes (Signed)
Family Medicine Teaching Service Daily Progress Note Intern Pager: [phone]  Patient name: [name] Medical record number: [id number] Date of birth: 1956/01/30 Age: 58 y.o. Gender: female  Primary Care Provider: Lavon Paganini, MD Consultants: None Code Status: Full  Pt Overview and Major Events to Date:  9/1: Admitted to med-surg with CHF exacerbation  Assessment and Plan: [name] is a 58 y.o. female presenting with CHF exacerbation. PMH is significant for diastolic CHF, restrictive lung disease, HLD, OSA, HTN, and T2DM. Also with elevated transaminases.   #CHF exacerbation, Acute on chronic diastolic CHF: EF week prior to arrival 65-70%. Being worked up for worsening heart failure as outpatient, started lasix 1 month ago. Reports baseline weight of 290 pounds, was 310 pounds last month. No crackles on exam, but with +2 pitting edema.  - CXR clear - troponin negative x2 - Lasix 60 mg IV bid, consider transition to oral - Consider transition to oral torsemide tomorrow - Metoprolol 50 mg twice a day  - Aspirin 81 mg  - Strict ins and outs  - Daily weights  - O2 as needed to keep oxygen above 90%  - Will need to document ambulatory sat if patient requires home O2  #Elevated transaminases: Hepatic congestion vs NAFLD. Patient reports no history of any alcohol use. These were normal roughly about a month ago. No abdominal pain on exam. Hepatojugular reflex difficult to appreciate. HCV negative. - Improving, continue to monitor  #Hypertension: Reported history of stopping lisinopril due to changing in kidney function.  - Continue amlodipine, consider down titration in the setting o LE edema - Stopped Maxide, started lisinopril 10 mg  - Started hydrochlorothiazide 12.5 mg   #Type 2 diabetes: Last hemoglobin A1c of 8.3. Home dose of injecting 95 units of Lantus 2 times daily with also meal coverage of 20 units 3 times a day.  - Titrate lantus to 20U daily - CBG monitoring  with meals and each bedtime  - Moderate sliding scale insulin coverage  - Each bedtime coverage   #Hyperlipidemia: The lipid panel conducted on 8/10 and already on a statin  - Continue home statin   #Asthma, restrictive lung disease: Questionable history of asthma. PFTs showed restrictive picture. No wheezing on exam and no reported albuterol use.  - Continue home flovent, albuterol prn  #Morbid obesity: BMI of 56.6. Baseline of 290 pounds.   #OSA: Reports using his CPAP at night  - CPAP when necessary each bedtime   FEN/GI: carb modified diet/ 1/2 NS 75 mL/hr  Prophylaxis: heparin subQ  Disposition: Admitted to [facility]. Anticipate transition to oral diuretics tomorrow and potentially discharged home.  Subjective:  NAEON. Down almost 4L since admission, down 6 pounds. CBG in the 200s-300s overnight. Also reports that her nose and mouth are dry due to CPAP overnight.   Objective: Temp:  [97.9 F (36.6 C)-98.6 F (37 C)] 98.6 F (37 C) (09/08 0519) Pulse Rate:  [75-96] 96 (09/08 0519) Resp:  [18-22] 18 (09/08 0519) BP: (115-165)/(59-78) 125/78 mmHg (09/08 0519) SpO2:  [93 %-97 %] 93 % (09/08 0519) Weight:  [313 lb 7.9 oz (142.2 kg)] 313 lb 7.9 oz (142.2 kg) (09/08 0519) Physical Exam: General: NAD, sitting on bed  Cardiovascular: RRR, no murmurs   Respiratory: CTAB, some extra effort with breathing, placed on nasal cannula 3 L, no wheezing, crackles, rhonchi  Abdomen: Obese, soft, nontender nondistended, positive bowel sounds  Extremities: +2 pitting edema in bilateral lower extremities to the knees,  Skin: No rashes  Neuro: No gross deficits   Laboratory:  Recent Labs Lab 05/02/2014 2200 04/30/14 0415 05/01/14 0431  WBC 12.7* 11.8* 11.4*  HGB 11.5* 11.5* 11.7*  HCT 38.1 36.6 38.4  PLT 267 278 303    Recent Labs Lab 05/09/2014 2200 04/30/14 0415 05/01/14 0431  NA 142 140 139  K 3.7 3.4* 3.7  CL 99 98 95*  CO2 30 29 31   BUN 18 16 21   CREATININE 1.16* 1.12*  1.19*  CALCIUM 9.0 8.7 8.8  PROT 8.5* 8.1 8.5*  BILITOT 0.5 0.6 0.7  ALKPHOS 266* 245* 239*  ALT 69* 61* 45*  AST 63* 45* 24  GLUCOSE 117* 88 252*    Imaging/Diagnostic Tests: CXR (9/7): IMPRESSION:  Cardiac enlargement with pulmonary vascular congestion. No edema or  consolidation.   Dimas Chyle, MD 05/01/2014, 7:14 AM PGY-1, [place] Intern pager: [phone], text pages welcome

## 2014-05-01 NOTE — Care Management Note (Addendum)
  Page 1 of 1   05/02/2014     5:15:35 PM CARE MANAGEMENT NOTE 05/02/2014  Patient:  [name], [name]   Account Number:  [id number]  Date Initiated:  05/01/2014  Documentation initiated by:  Mariann Laster  Subjective/Objective Assessment:   SOB, CHF     Action/Plan:   CM to follow for disposition needs   Anticipated DC Date:  05/03/2014   Anticipated DC Plan:  HOME/SELF CARE         Choice offered to / List presented to:             Status of service:  Completed, signed off Medicare Important Message given?   (If response is "NO", the following Medicare IM given date fields will be blank) Date Medicare IM given:   Medicare IM given by:   Date Additional Medicare IM given:   Additional Medicare IM given by:    Discharge Disposition:  HOME/SELF CARE  Per UR Regulation:  Reviewed for med. necessity/level of care/duration of stay  If discussed at [place] of Stay Meetings, dates discussed:    Comments:  Samanthia Howland RN, BSN, MSHL, CCM  Nurse - Case Manager,  ([facility] [place])  [phone]  05/01/2014 IM:  n/a age 58 and MCD Social:  From home / self care Home DME:  cane, walker, NO Home Oxygen PCS Services active with [facility] 5 days / week 2 hours /day PCP:  Dr. Lavon Paganini - Last appt 05/01/2014 during IP stay Cardiologist appt due May 25, 2014 Disposition Plan:  Home / [place].

## 2014-05-01 NOTE — Progress Notes (Signed)
FMTS Attending Daily Note:  Shelly Sabal MD  [phone] pager  Family Practice pager:  [phone] I have seen and examined this patient and have reviewed their chart. I have discussed this patient with the resident. I agree with the resident's findings, assessment and care plan.  Additionally:  - Much improved from CHF standpiont.  Breathing better, edema improved though still +2 BL LE's.  Lungs distant but clear - Plan to hold at least one more day for IV diuresis.  Ambulatory pulse ox to ensure no need for home oxygen.  - She is on Norvasc and has been so for some time.  We will DC this med and uptitrate her other doses of beta blocker and lisinopril to control her BP's.    Alveda Reasons, MD 05/01/2014 3:12 PM

## 2014-05-02 ENCOUNTER — Inpatient Hospital Stay (HOSPITAL_COMMUNITY): Payer: Medicaid Other

## 2014-05-02 DIAGNOSIS — R0902 Hypoxemia: Secondary | ICD-10-CM

## 2014-05-02 LAB — COMPREHENSIVE METABOLIC PANEL
ALBUMIN: 3.2 g/dL — AB (ref 3.5–5.2)
ALT: 33 U/L (ref 0–35)
AST: 20 U/L (ref 0–37)
Alkaline Phosphatase: 199 U/L — ABNORMAL HIGH (ref 39–117)
Anion gap: 15 (ref 5–15)
BUN: 24 mg/dL — ABNORMAL HIGH (ref 6–23)
CO2: 32 meq/L (ref 19–32)
CREATININE: 1.35 mg/dL — AB (ref 0.50–1.10)
Calcium: 8.8 mg/dL (ref 8.4–10.5)
Chloride: 91 mEq/L — ABNORMAL LOW (ref 96–112)
GFR calc Af Amer: 48 mL/min — ABNORMAL LOW (ref >90)
GFR, EST NON AFRICAN AMERICAN: 42 mL/min — AB (ref >90)
Glucose, Bld: 252 mg/dL — ABNORMAL HIGH (ref 70–99)
Potassium: 3.9 mEq/L (ref 3.7–5.3)
Sodium: 138 mEq/L (ref 137–147)
Total Bilirubin: 0.9 mg/dL (ref 0.3–1.2)
Total Protein: 8.3 g/dL (ref 6.0–8.3)

## 2014-05-02 LAB — CBC WITH DIFFERENTIAL/PLATELET
BASOS ABS: 0 10*3/uL (ref 0.0–0.1)
Basophils Relative: 0 % (ref 0–1)
EOS ABS: 0 10*3/uL (ref 0.0–0.7)
Eosinophils Relative: 0 % (ref 0–5)
HCT: 35.8 % — ABNORMAL LOW (ref 36.0–46.0)
Hemoglobin: 10.7 g/dL — ABNORMAL LOW (ref 12.0–15.0)
LYMPHS PCT: 19 % (ref 12–46)
Lymphs Abs: 2.2 10*3/uL (ref 0.7–4.0)
MCH: 26.6 pg (ref 26.0–34.0)
MCHC: 29.9 g/dL — ABNORMAL LOW (ref 30.0–36.0)
MCV: 89.1 fL (ref 78.0–100.0)
Monocytes Absolute: 1 10*3/uL (ref 0.1–1.0)
Monocytes Relative: 8 % (ref 3–12)
Neutro Abs: 8.7 10*3/uL — ABNORMAL HIGH (ref 1.7–7.7)
Neutrophils Relative %: 73 % (ref 43–77)
PLATELETS: 290 10*3/uL (ref 150–400)
RBC: 4.02 MIL/uL (ref 3.87–5.11)
RDW: 15.5 % (ref 11.5–15.5)
WBC: 11.9 10*3/uL — ABNORMAL HIGH (ref 4.0–10.5)

## 2014-05-02 LAB — URINALYSIS, ROUTINE W REFLEX MICROSCOPIC
Bilirubin Urine: NEGATIVE
Glucose, UA: NEGATIVE mg/dL
Hgb urine dipstick: NEGATIVE
KETONES UR: NEGATIVE mg/dL
LEUKOCYTES UA: NEGATIVE
NITRITE: NEGATIVE
PH: 5 (ref 5.0–8.0)
PROTEIN: NEGATIVE mg/dL
Specific Gravity, Urine: 1.016 (ref 1.005–1.030)
Urobilinogen, UA: 0.2 mg/dL (ref 0.0–1.0)

## 2014-05-02 LAB — GLUCOSE, CAPILLARY
Glucose-Capillary: 247 mg/dL — ABNORMAL HIGH (ref 70–99)
Glucose-Capillary: 277 mg/dL — ABNORMAL HIGH (ref 70–99)
Glucose-Capillary: 193 mg/dL — ABNORMAL HIGH (ref 70–99)
Glucose-Capillary: 298 mg/dL — ABNORMAL HIGH (ref 70–99)

## 2014-05-02 MED ORDER — INSULIN GLARGINE 100 UNIT/ML ~~LOC~~ SOLN
25.0000 [IU] | Freq: Every day | SUBCUTANEOUS | Status: DC
Start: 2014-05-02 — End: 2014-05-03
  Administered 2014-05-02: 25 [IU] via SUBCUTANEOUS
  Filled 2014-05-02 (×3): qty 0.25

## 2014-05-02 MED ORDER — VANCOMYCIN HCL 10 G IV SOLR
1750.0000 mg | INTRAVENOUS | Status: DC
  Administered 2014-05-03: 1750 mg via INTRAVENOUS
  Filled 2014-05-02 (×2): qty 1750

## 2014-05-02 MED ORDER — TORSEMIDE 20 MG PO TABS
30.0000 mg | ORAL_TABLET | Freq: Every day | ORAL | Status: DC
  Administered 2014-05-02 – 2014-05-03 (×2): 30 mg via ORAL
  Filled 2014-05-02 (×3): qty 1

## 2014-05-02 MED ORDER — DEXTROSE 5 % IV SOLN
1.0000 g | Freq: Three times a day (TID) | INTRAVENOUS | Status: DC
  Administered 2014-05-02 – 2014-05-03 (×5): 1 g via INTRAVENOUS
  Filled 2014-05-02 (×7): qty 1

## 2014-05-02 MED ORDER — LISINOPRIL 10 MG PO TABS
10.0000 mg | ORAL_TABLET | Freq: Every day | ORAL | Status: DC
  Administered 2014-05-02: 10 mg via ORAL
  Filled 2014-05-02 (×2): qty 1

## 2014-05-02 MED ORDER — VANCOMYCIN HCL 10 G IV SOLR
2500.0000 mg | INTRAVENOUS | Status: AC
  Administered 2014-05-02: 2500 mg via INTRAVENOUS
  Filled 2014-05-02: qty 2500

## 2014-05-02 NOTE — Plan of Care (Signed)
Problem: Phase I Progression Outcomes Goal: Dyspnea controlled at rest (HF) Outcome: Not Progressing Patient is still very dyspneic while lying in the bed.  On 3L currently. Tried weaning down to 2L at the beginning of the night.

## 2014-05-02 NOTE — Progress Notes (Signed)
Inpatient Diabetes Program Recommendations  AACE/ADA: New Consensus Statement on Inpatient Glycemic Control (2013)  Target Ranges:  Prepandial:   less than 140 mg/dL      Peak postprandial:   less than 180 mg/dL (1-2 hours)      Critically ill patients:  140 - 180 mg/dL   Reason for Visit: Hyperglycemia  Diabetes history: DM2 Outpatient Diabetes medications: Lantus 95 units bid, Novolin Regular 20 units tidwc Current orders for Inpatient glycemic control: Lantus 25 units QAM, Novolog moderate tidwc and hs  Results for ANGELIGUE, BOWNE (MRN [id number]) as of 05/02/2014 09:51  Ref. Range 05/01/2014 06:47 05/01/2014 10:29 05/01/2014 16:50 05/01/2014 20:48 05/02/2014 04:57  Glucose-Capillary Latest Range: 70-99 mg/dL 234 (H) 193 (H) 247 (H) 256 (H) 247 (H)  Results for SABRIYAH, WILCHER (MRN [id number]) as of 05/02/2014 09:51  Ref. Range 04/02/2014 05:01  Hemoglobin A1C Latest Range: <5.7 % 8.3   HgbA1C of 8.3% reflects poor glycemic control at home. Needs insulin adjustment at discharge. Taking large doses of basal insulin at home.  On approx 20% of home dose of Lantus. Blood sugars in 200s.  Inpatient Diabetes Program Recommendations Insulin - Basal: Increase Lantus to 35 units QAM Correction (SSI): Add HS correction Insulin - Meal Coverage: Add Novolog 8 units tidwc for meal coverage insulin if pt eats >50% meal  Note: Will continue to follow. F/U with PCP for diabetes management after discharge. Thank you. Lorenda Peck, RD, LDN, CDE Inpatient Diabetes Coordinator [phone]

## 2014-05-02 NOTE — Progress Notes (Signed)
Pt having dyspnea and O2 sat at 88% with 3L per [place]. Placed on high fowlers position, increase O2 to 5 L, Albuterol neb prn given and the scheduled Fluticasone puff per RRT. IV antibiotic due given. Will continue to monitor pt

## 2014-05-02 NOTE — Progress Notes (Signed)
1720 Seating at bedside cardiac chair eating dinner. Claimed feeling better . Occasionally coughing.No apparent distress

## 2014-05-02 NOTE — Progress Notes (Signed)
Pt stated " I feel better with the breathing treatment". O2 titrated down to 4L with O2 sat at 96%

## 2014-05-02 NOTE — Progress Notes (Signed)
One liter of saline hung with new tubing in order to administer antibiotics.

## 2014-05-02 NOTE — Progress Notes (Signed)
FMTS Attending Daily Note:  Shelly Sabal MD  [phone] pager  Family Practice pager:  [phone] I have seen and examined this patient and have reviewed their chart. I have discussed this patient with the resident. I agree with the resident's findings, assessment and care plan.  Additionally:  - Initial plan today was to transition to PO diuretics, DC oxygen, and send her home.   - however now with fever plus opacities on radiography in addition to her cough.  Calling HCAP and treating as such.   - States she feels much better after administration of abx.   - Once afebrile x 24 hours, switch to PO abx and DC home.   Alveda Reasons, MD 05/02/2014 3:48 PM

## 2014-05-02 NOTE — Progress Notes (Signed)
ANTIBIOTIC CONSULT NOTE - INITIAL  Pharmacy Consult for vancomycin and cefepime Indication: pneumonia  Allergies  Allergen Reactions  . Amoxicillin Other (See Comments)    unknown  . Azor [Amlodipine-Olmesartan] Swelling    Pt tolerates amlodipine alone  . Thiazide-Type Diuretics Other (See Comments)    Renal issues    Patient Measurements: Height: 5\' 3"  (160 cm) Weight: 309 lb 8.4 oz (140.4 kg) IBW/kg (Calculated) : 52.4  Vital Signs: Temp: 102.1 F (38.9 C) (09/09 1046) Temp src: Oral (09/09 1046) BP: 138/70 mmHg (09/09 1046) Pulse Rate: 87 (09/09 1046) Intake/Output from previous day: 09/08 0701 - 09/09 0700 In: 840 [P.O.:840] Out: 2750 [Urine:2750] Intake/Output from this shift: Total I/O In: 360 [P.O.:360] Out: 350 [Urine:350]  Labs:  Recent Labs  04/30/14 0415 05/01/14 0431 05/02/14 0415 05/02/14 1025  WBC 11.8* 11.4*  --  11.9*  HGB 11.5* 11.7*  --  10.7*  PLT 278 303  --  290  CREATININE 1.12* 1.19* 1.35*  --    Estimated Creatinine Clearance: 61.3 ml/min (by C-G formula based on Cr of 1.35). No results found for this basename: VANCOTROUGH, VANCOPEAK, VANCORANDOM, GENTTROUGH, GENTPEAK, GENTRANDOM, TOBRATROUGH, TOBRAPEAK, TOBRARND, AMIKACINPEAK, AMIKACINTROU, AMIKACIN,  in the last 72 hours   Microbiology: No results found for this or any previous visit (from the past 720 hour(s)).  Medical History: Past Medical History  Diagnosis Date  . Morbid obesity   . Obstructive airway disease   . Hyperlipidemia   . Reactive airway disease   . CHF (congestive heart failure)   . DVT (deep venous thrombosis) 09/2014    LLE, Temporary IVC filter placed at that time (unable to be removed), on Coumadin 12/2004 - 05/2005  . Anemia, iron deficiency 04/02/2005    H/o iron deficiency anemia in setting of heavy vaginal bleeding in 2006.  Hgb 03/2014 12.7   . 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 Shortness of  breath   . Hypertension   . TIA (transient ischemic attack)   . Diabetes mellitus     INSULIN DEPENDENT  . Transfusion of blood product refused for religious reason     Medications:  Prescriptions prior to admission  Medication Sig Dispense Refill  . albuterol (PROVENTIL HFA;VENTOLIN HFA) 108 (90 BASE) MCG/ACT inhaler Inhale 1 puff into the lungs every 4 (four) hours as needed. For shortness of breath.  1 Inhaler  0  . albuterol (PROVENTIL) (2.5 MG/3ML) 0.083% nebulizer solution Take 2.5 mg by nebulization every 6 (six) hours as needed for wheezing or shortness of breath (SOB).      Marland Kitchen amLODipine (NORVASC) 10 MG tablet Take 10 mg by mouth daily.      Marland Kitchen aspirin EC 81 MG tablet Take 81 mg by mouth daily.      Marland Kitchen atorvastatin (LIPITOR) 40 MG tablet Take 40 mg by mouth daily.      . beclomethasone (QVAR) 40 MCG/ACT inhaler Inhale 2 puffs into the lungs 2 (two) times daily.      . beclomethasone (QVAR) 40 MCG/ACT inhaler Inhale 1 puff into the lungs 2 (two) times daily.      . furosemide (LASIX) 40 MG tablet Take 40 mg by mouth daily.      Marland Kitchen ibuprofen (ADVIL,MOTRIN) 200 MG tablet Take 400 mg by mouth every 6 (six) hours as needed.      . insulin glargine (LANTUS) 100 UNIT/ML injection Inject 95 Units into the skin 2 (two) times  daily.       . insulin regular (NOVOLIN R,HUMULIN R) 100 units/mL injection Inject 20 Units into the skin 3 (three) times daily before meals.      . metoprolol (LOPRESSOR) 50 MG tablet Take 50 mg by mouth 2 (two) times daily.      Marland Kitchen triamterene-hydrochlorothiazide (MAXZIDE-25) 37.5-25 MG per tablet Take 1 tablet by mouth daily.       Assessment: 58 y/o obese female who presented to the ED on 9/6 with SOB and is currently being treated for a CHF exacerbation. Pharmacy consulted to begin vancomycin and cefepime for HCAP. CXR with atelectasis versus infiltrate. She is febrile, WBC are slightly elevated, and SCr is up slightly. Blood cultures are pending.  Goal of Therapy:   Vancomycin trough level 15-20 mcg/ml Eradication of infection  Plan:  - Vancomycin 2500 mg IV x1 then 1750 mg IV q24h - Cefepime 1 g IV q8h - Monitor renal function, clinical course, and culture data  [facility], Pharm.D., BCPS Clinical Pharmacist Pager: [phone] 05/02/2014 12:17 PM

## 2014-05-02 NOTE — Progress Notes (Signed)
Vindex.Nunnery MD made aware of pt's t 101.7  Of previous shift

## 2014-05-02 NOTE — Progress Notes (Signed)
Placed patient on CPAP via FFM at 10.0 cm H20 with 6 lpm O2 bleed in.  Patient tolerating well at this time. HR 85, RR 22 Spo2 95%. RN aware.   RT will monitor as needed.

## 2014-05-02 NOTE — Progress Notes (Signed)
Family Medicine Teaching Service Daily Progress Note Intern Pager: [phone]  Patient name: [name] Medical record number: [id number] Date of birth: [birth year] Age: 58 y.o. Gender: female  Primary Care Provider: Lavon Paganini, MD Consultants: None Code Status: Full  Pt Overview and Major Events to Date:  9/7: Admitted to med-surg with CHF exacerbation  Assessment and Plan: [name] is a 58 y.o. female presenting with CHF exacerbation. PMH is significant for diastolic CHF, restrictive lung disease, HLD, OSA, HTN, and T2DM. Also with elevated transaminases.   #CHF exacerbation, Acute on chronic diastolic CHF: EF 52-77%. Reports baseline weight of 290 pounds, was 310 pounds last month. Still dyspneic at rest. S/p aggressive lasix IV diuresis. - Patient is down almost 6L and near baseline weight - Transition to oral torsemide today - Continue metoprolol 50mg  twice daily, ASA 81mg  - O2 as needed to keep oxygen above 90%  - Will need to document ambulatory sat if patient requires home O2  #Fever. Likely due to atelectasis, though can not rule out pneumonia or infectious source. -f/u cxr, WBC -Incentive spirometry -f/u UA  #Elevated transaminases: Likely secondary to hepatic congestion secondary to CHF exacerbation. - Resolved with diuresis.   #Hypertension: Elevated here since stopping amlodipine given patient's LE edema. - Lisinopril to 10 mg - Continue hydrochlorothiazide 12.5 mg  - Metoprolol as above  #Type 2 diabetes: A1c 8.3. Home regimen: 95U Lantus twice daily with also meal coverage of 20U  Regular insulin 3 times a day with meals - Lantus to 25U daily - CBG monitoring with meals and each bedtime  - Moderate sliding scale insulin coverage  - Bedtime coverage   #Hyperlipidemia: Continue home statin   #Asthma, restrictive lung disease:  No wheezing on exam and no reported albuterol use. On 3L oxygen. - Continue home flovent, albuterol prn  #Morbid  obesity: BMI of 56.6. Baseline of 290 pounds.   #OSA: Reports using his CPAP at night  - CPAP when necessary each bedtime   FEN/GI: carb modified diet Prophylaxis: heparin subQ  Disposition: Admitted to [facility]. Potential discharge home later today pending response to transition to oral torsemide and fever work up.   Subjective:  Febrile overnight. Given tylenol which helped some. Reports more labored breathing. Down almost 6L since admission, down 10 pounds. CBG in the 200s-300s overnight.   Objective: Temp:  [98.1 F (36.7 C)-101.7 F (38.7 C)] 98.1 F (36.7 C) (09/09 0648) Pulse Rate:  [80-96] 80 (09/09 0648) Resp:  [19-20] 20 (09/09 0648) BP: (110-147)/(50-80) 142/80 mmHg (09/09 0648) SpO2:  [93 %-95 %] 95 % (09/09 0648) Weight:  [309 lb 8.4 oz (140.4 kg)] 309 lb 8.4 oz (140.4 kg) (09/09 8242) Physical Exam: General: NAD, sitting on bed  Cardiovascular: RRR, no murmurs   Respiratory: Poor inspiratory effort with distant breath sounds. CTAB, some extra effort with breathing, placed on nasal cannula 3 L, no wheezing, crackles, rhonchi  Abdomen: Obese, soft, nontender nondistended, positive bowel sounds  Extremities: 1+  edema in bilateral lower extremities to the knees,  Skin: No rashes  Neuro: No gross deficits   Laboratory:  Recent Labs Lab 05/01/2014 2200 04/30/14 0415 05/01/14 0431  WBC 12.7* 11.8* 11.4*  HGB 11.5* 11.5* 11.7*  HCT 38.1 36.6 38.4  PLT 267 278 303    Recent Labs Lab 04/30/14 0415 05/01/14 0431 05/02/14 0415  NA 140 139 138  K 3.4* 3.7 3.9  CL 98 95* 91*  CO2 29 31 32  BUN 16 21 24*  CREATININE  1.12* 1.19* 1.35*  CALCIUM 8.7 8.8 8.8  PROT 8.1 8.5* 8.3  BILITOT 0.6 0.7 0.9  ALKPHOS 245* 239* 199*  ALT 61* 45* 33  AST 45* 24 20  GLUCOSE 88 252* 252*    Imaging/Diagnostic Tests: None   Dimas Chyle, MD 05/02/2014, 7:16 AM PGY-1, [place] Intern pager: [phone], text pages welcome

## 2014-05-02 NOTE — Progress Notes (Addendum)
Patient had temperature per nurse tech of 101.  Rechecked and temperature was 100.4.  I gave 2 tylenol and recheck was 100.4.  Will recheck again in the morning  Patient states she feels better after tylenol, cold cloth, and ice chips.  --- At 0430 temperature recheck was 98.7 Patient gown is sweaty and patient states she felt delirious for a few minutes while phlebotomy was in her room taking her blood.  Patient says she is feeling better than that point.  Educated for her to call me if that happens again.  Will continue to monitor for any sudden changes.

## 2014-05-03 ENCOUNTER — Encounter (HOSPITAL_COMMUNITY): Payer: Self-pay | Admitting: Radiology

## 2014-05-03 ENCOUNTER — Inpatient Hospital Stay (HOSPITAL_COMMUNITY): Payer: Medicaid Other

## 2014-05-03 DIAGNOSIS — J189 Pneumonia, unspecified organism: Secondary | ICD-10-CM

## 2014-05-03 LAB — GLUCOSE, CAPILLARY
GLUCOSE-CAPILLARY: 280 mg/dL — AB (ref 70–99)
Glucose-Capillary: 242 mg/dL — ABNORMAL HIGH (ref 70–99)
Glucose-Capillary: 314 mg/dL — ABNORMAL HIGH (ref 70–99)
Glucose-Capillary: 264 mg/dL — ABNORMAL HIGH (ref 70–99)

## 2014-05-03 LAB — COMPREHENSIVE METABOLIC PANEL
ALT: 28 U/L (ref 0–35)
AST: 27 U/L (ref 0–37)
Albumin: 3.3 g/dL — ABNORMAL LOW (ref 3.5–5.2)
Alkaline Phosphatase: 186 U/L — ABNORMAL HIGH (ref 39–117)
Anion gap: 17 — ABNORMAL HIGH (ref 5–15)
BUN: 25 mg/dL — ABNORMAL HIGH (ref 6–23)
CALCIUM: 8.8 mg/dL (ref 8.4–10.5)
CO2: 30 meq/L (ref 19–32)
CREATININE: 1.28 mg/dL — AB (ref 0.50–1.10)
Chloride: 87 mEq/L — ABNORMAL LOW (ref 96–112)
GFR, EST AFRICAN AMERICAN: 52 mL/min — AB (ref >90)
GFR, EST NON AFRICAN AMERICAN: 45 mL/min — AB (ref >90)
GLUCOSE: 216 mg/dL — AB (ref 70–99)
Potassium: 4.7 mEq/L (ref 3.7–5.3)
Sodium: 134 mEq/L — ABNORMAL LOW (ref 137–147)
Total Bilirubin: 0.9 mg/dL (ref 0.3–1.2)
Total Protein: 8.2 g/dL (ref 6.0–8.3)

## 2014-05-03 LAB — CBC WITH DIFFERENTIAL/PLATELET
Basophils Absolute: 0 10*3/uL (ref 0.0–0.1)
Basophils Relative: 0 % (ref 0–1)
Eosinophils Absolute: 0.2 10*3/uL (ref 0.0–0.7)
Eosinophils Relative: 2 % (ref 0–5)
HEMATOCRIT: 36.6 % (ref 36.0–46.0)
Hemoglobin: 11.4 g/dL — ABNORMAL LOW (ref 12.0–15.0)
LYMPHS PCT: 23 % (ref 12–46)
Lymphs Abs: 2.7 10*3/uL (ref 0.7–4.0)
MCH: 27.7 pg (ref 26.0–34.0)
MCHC: 31.1 g/dL (ref 30.0–36.0)
MCV: 88.8 fL (ref 78.0–100.0)
MONO ABS: 0.9 10*3/uL (ref 0.1–1.0)
Monocytes Relative: 8 % (ref 3–12)
Neutro Abs: 7.7 10*3/uL (ref 1.7–7.7)
Neutrophils Relative %: 67 % (ref 43–77)
Platelets: 286 10*3/uL (ref 150–400)
RBC: 4.12 MIL/uL (ref 3.87–5.11)
RDW: 15.3 % (ref 11.5–15.5)
WBC: 11.5 10*3/uL — AB (ref 4.0–10.5)

## 2014-05-03 MED ORDER — IOHEXOL 350 MG/ML SOLN
100.0000 mL | Freq: Once | INTRAVENOUS | Status: AC | PRN
  Administered 2014-05-03: 100 mL via INTRAVENOUS

## 2014-05-03 MED ORDER — SODIUM CHLORIDE 0.9 % IV BOLUS (SEPSIS)
250.0000 mL | Freq: Once | INTRAVENOUS | Status: AC
  Administered 2014-05-03: 250 mL via INTRAVENOUS

## 2014-05-03 MED ORDER — INSULIN GLARGINE 100 UNIT/ML ~~LOC~~ SOLN
30.0000 [IU] | Freq: Every day | SUBCUTANEOUS | Status: DC
  Administered 2014-05-03: 30 [IU] via SUBCUTANEOUS
  Filled 2014-05-03 (×2): qty 0.3

## 2014-05-03 MED ORDER — LISINOPRIL 20 MG PO TABS
20.0000 mg | ORAL_TABLET | Freq: Every day | ORAL | Status: DC
  Administered 2014-05-03: 20 mg via ORAL
  Filled 2014-05-03 (×2): qty 1

## 2014-05-03 NOTE — Progress Notes (Signed)
Unable to get No 20G IV  Placed  For CT scan as CT requested on 2 attempts by CN and IV team.  IV team instructed will call another iv nurse.  Dr. Lajean Silvius made aware.  Karie Kirks, Therapist, sports.

## 2014-05-03 NOTE — Progress Notes (Signed)
Family Medicine Teaching Service Daily Progress Note Intern Pager: [phone]  Patient name: [name] Medical record number: [id number] Date of birth: 1956/05/28 Age: 58 y.o. Gender: female  Primary Care Provider: Lavon Paganini, MD Consultants: None Code Status: Full  Pt Overview and Major Events to Date:  9/7: Admitted to med-surg with CHF exacerbation 9/9: Developed fever with infiltrates on CXR- started on vanc/cefepime  Assessment and Plan: [name] is a 58 y.o. female presenting with CHF exacerbation. PMH is significant for diastolic CHF, restrictive lung disease, HLD, OSA, HTN, and T2DM. Also with elevated transaminases.   #HCAP. Patient with fever and infiltrates/atelectasis on CXR. Started on vanc+cefepime. Continues to spike intermittent fevers. Will continue until afebrile, then transition to PO antibiotics.  -f/u CTA -f/u WBC -f/u BCx -Incentive spirometry  #CHF exacerbation, Acute on chronic diastolic CHF: EF 75-64%. Reports baseline weight of 290 pounds, was 310 pounds last month. Still dyspneic at rest. S/p aggressive lasix IV diuresis. - Patient is down almost 8L and near baseline weight - Now on oral torsemide  - Continue metoprolol 50mg  twice daily, ASA 81mg  - O2 as needed to keep oxygen above 90%  - Will need to document ambulatory sat if patient requires home O2  #Elevated transaminases: Likely secondary to hepatic congestion secondary to CHF exacerbation. - Resolved with diuresis.   #Hypertension: Elevated here since stopping amlodipine given patient's LE edema. - Lisinopril 20 mg daily - Continue hydrochlorothiazide 12.5 mg daily - Metoprolol as above  #Type 2 diabetes: A1c 8.3. Home regimen: 95U Lantus twice daily with also meal coverage of 20U regular insulin 3 times a day with meals - Lantus to 30U daily - CBG monitoring with meals and each bedtime  - Moderate sliding scale insulin coverage  - Bedtime coverage   #Hyperlipidemia: Continue  home statin   #Asthma, restrictive lung disease:  No wheezing on exam and no reported albuterol use.  - Continue home flovent, albuterol prn  #Morbid obesity: BMI of 56.6. Baseline of 290 pounds.   #OSA: Reports using her CPAP at night  - CPAP when necessary each bedtime   FEN/GI: carb modified diet Prophylaxis: heparin subQ  Disposition: Admitted to [facility]. Anticipate discharge home once stable on oral antibiotics.   Subjective:  Continued to be febrile overnight with increased dyspnea. Got an albuterol treatment, which greatly improved symptoms. Patient is feeling better this morning. No chest pain. Still short of breath at rest.   Objective: Temp:  [99.3 F (37.4 C)-102.1 F (38.9 C)] 100.6 F (38.1 C) (09/10 0335) Pulse Rate:  [81-91] 91 (09/10 0335) Resp:  [22-38] 24 (09/10 0335) BP: (114-158)/(46-84) 158/60 mmHg (09/10 0335) SpO2:  [86 %-96 %] 96 % (09/10 0335) Weight:  [309 lb 15.5 oz (140.6 kg)] 309 lb 15.5 oz (140.6 kg) (09/10 0335) Physical Exam: General: NAD, sitting on bed with nasal cannula in place Cardiovascular: RRR, distant heart sounds, no murmurs   Respiratory: Limited exam given body habitus. Poor inspiratory effort with distant breath sounds. CTAB, some extra effort with breathing, no wheezing, crackles, or ronchi appreciated Abdomen: Obese, soft, nontender nondistended, positive bowel sounds  Extremities: 1+ nonpitting edema in bilateral lower extremities to the knees,  Skin: No rashes  Neuro: No gross deficits  Laboratory:  Recent Labs Lab 04/30/14 0415 05/01/14 0431 05/02/14 1025  WBC 11.8* 11.4* 11.9*  HGB 11.5* 11.7* 10.7*  HCT 36.6 38.4 35.8*  PLT 278 303 290    Recent Labs Lab 04/30/14 0415 05/01/14 0431 05/02/14 0415  NA  140 139 138  K 3.4* 3.7 3.9  CL 98 95* 91*  CO2 29 31 32  BUN 16 21 24*  CREATININE 1.12* 1.19* 1.35*  CALCIUM 8.7 8.8 8.8  PROT 8.1 8.5* 8.3  BILITOT 0.6 0.7 0.9  ALKPHOS 245* 239* 199*  ALT 61* 45* 33  AST  45* 24 20  GLUCOSE 88 252* 252*    Imaging/Diagnostic Tests: None   Dimas Chyle, MD 05/03/2014, 7:14 AM PGY-1, [place] Intern pager: [phone], text pages welcome

## 2014-05-03 NOTE — Progress Notes (Signed)
FMTS Attending Daily Note:  Annabell Sabal MD  [phone] pager  Family Practice pager:  [phone] I have seen and examined this patient and have reviewed their chart. I have discussed this patient with the resident. I agree with the resident's findings, assessment and care plan.  Additionally:  - still febrile despite abx.  Hypoxic last night.  Still tachypneic.  It's not registered in the EMR but she was tachycardic on my exam.   - Concern is for PE in this obese female with relative immobility.  For CTA today.  - If nothing else, CT will better clarify lung disease/PNA.  Alveda Reasons, MD 05/03/2014 12:14 PM

## 2014-05-03 NOTE — Progress Notes (Signed)
UR completed Fin Hupp K. Valli Randol, RN, BSN, [place], CCM  05/03/2014 5:26 PM

## 2014-05-03 NOTE — Progress Notes (Signed)
IV 20G, RUFA placed by IV team for CT.  Courtney in [place] made aware and instructed they will send for her.  Travel info. Provided.  Will continue to monitor.  Karie Kirks, Therapist, sports.

## 2014-05-04 ENCOUNTER — Ambulatory Visit: Payer: Medicaid Other | Admitting: Cardiology

## 2014-05-04 LAB — CBC WITH DIFFERENTIAL/PLATELET
Basophils Absolute: 0 10*3/uL (ref 0.0–0.1)
Basophils Relative: 0 % (ref 0–1)
Eosinophils Absolute: 0.2 10*3/uL (ref 0.0–0.7)
Eosinophils Relative: 1 % (ref 0–5)
HCT: 38.8 % (ref 36.0–46.0)
HEMOGLOBIN: 11.3 g/dL — AB (ref 12.0–15.0)
Lymphocytes Relative: 44 % (ref 12–46)
Lymphs Abs: 7.2 10*3/uL — ABNORMAL HIGH (ref 0.7–4.0)
MCH: 27.4 pg (ref 26.0–34.0)
MCHC: 29.1 g/dL — ABNORMAL LOW (ref 30.0–36.0)
MCV: 94.2 fL (ref 78.0–100.0)
MONO ABS: 1.1 10*3/uL — AB (ref 0.1–1.0)
Monocytes Relative: 7 % (ref 3–12)
NEUTROS PCT: 48 % (ref 43–77)
Neutro Abs: 7.8 10*3/uL — ABNORMAL HIGH (ref 1.7–7.7)
Platelets: 177 10*3/uL (ref 150–400)
RBC: 4.12 MIL/uL (ref 3.87–5.11)
RDW: 15.5 % (ref 11.5–15.5)
WBC: 16.3 10*3/uL — ABNORMAL HIGH (ref 4.0–10.5)

## 2014-05-04 LAB — BASIC METABOLIC PANEL
Anion gap: 28 — ABNORMAL HIGH (ref 5–15)
BUN: 24 mg/dL — ABNORMAL HIGH (ref 6–23)
CALCIUM: 7.7 mg/dL — AB (ref 8.4–10.5)
CO2: 31 mEq/L (ref 19–32)
Chloride: 88 mEq/L — ABNORMAL LOW (ref 96–112)
Creatinine, Ser: 1.22 mg/dL — ABNORMAL HIGH (ref 0.50–1.10)
GFR calc Af Amer: 55 mL/min — ABNORMAL LOW (ref >90)
GFR calc non Af Amer: 47 mL/min — ABNORMAL LOW (ref >90)
GLUCOSE: 239 mg/dL — AB (ref 70–99)
Potassium: >7.7 mEq/L (ref 3.7–5.3)
Sodium: 147 mEq/L (ref 137–147)

## 2014-05-04 MED ORDER — EPINEPHRINE HCL 1 MG/ML IJ SOLN
INTRAMUSCULAR | Status: AC
  Filled 2014-05-04: qty 1

## 2014-05-04 MED FILL — Medication: Qty: 1 | Status: AC

## 2014-05-08 LAB — CULTURE, BLOOD (ROUTINE X 2)
Culture: NO GROWTH
Culture: NO GROWTH

## 2014-05-24 NOTE — Progress Notes (Signed)
Paged both sons of the patient 5-6 times. Still waiting for call back. AC notified.

## 2014-05-24 NOTE — Discharge Summary (Signed)
Family Medicine Teaching Service  Discharge Note : Attending Annabell Sabal MD Pager [phone] Inpatient Team Pager:  [phone]  I have reviewed this patient's death summary.  Agree with the resident's assessment as above.  Unclear cause of death.  Patient was chronically ill, refusing CPAP at night.  Seems to have been pulmonary issue--> pulmonary hemorrhage.  CTA did not reveal any PE, but did show PNA as thought.  No chronic renal failure or concern for [facility]'s/[place].  Patient's unfortunate passing caught [place] all by surprise.

## 2014-05-24 NOTE — Progress Notes (Signed)
Multiple attempts (8 calls) made to contact family about pts. Change in status. Attempts unsuccessful.

## 2014-05-24 NOTE — Progress Notes (Signed)
Pt found in bed unresponsive , no pulses. No signs of breathing noted. Code iniated. Chest compressions started.

## 2014-05-24 NOTE — Procedures (Signed)
Central Venous Catheter Insertion Procedure Note [name] [id number] 07/01/1956  Procedure: Insertion of Central Venous Catheter Indications: Assessment of intravascular volume, Drug and/or fluid administration and Frequent blood sampling  Procedure Details Consent: Unable to obtain consent because of emergent medical necessity. Time Out: Verified patient identification, verified procedure, site/side was marked, verified correct patient position, special equipment/implants available, medications/allergies/relevent history reviewed, required imaging and test results available.  Performed  Maximum sterile technique was used including gloves and mask. Skin prep: Chlorhexidine; local anesthetic administered A antimicrobial bonded/coated triple lumen catheter was placed in the right femoral vein due to emergent situation using the Seldinger technique.  Evaluation Blood flow good Complications: No apparent complications Patient did tolerate procedure well. Chest X-ray ordered to verify placement.  CXR: not needed.   Performed emergently during code blue resuscitation efforts as PIV had infiltrated.   [name], [facility] Pgr: [phone]  or [phone]

## 2014-05-24 NOTE — Discharge Summary (Signed)
[facility] Death Summary  Patient name: [name] Medical record number: [id number] Date of birth: May 01, 1956 Age: 58 y.o. Gender: female Date of Admission: 30-Apr-2014  Date of Death: 05/20/2014  Admitting Physician: Lupita Dawn, MD  Primary Care Provider: Lavon Paganini, MD Consultants: None  Indication for Hospitalization: CHF exacerbation  Discharge Diagnoses/Problem List:  CHF exacerbation, HCAP, hypertension, T2DM, HLD, Asthma, OSA, Morbid obesity  Disposition: Deceased  Brief Hospital Course:   Patient was relatively stable throughout her hospital course until around 0400 on 05-May-2014 when she was found to be unresponsive in her bed by the nursing staff with no signs of breathing and found to be pulseless. Code was initiated and chest compressions were started. Patient was found to be in asystole by cardiac monitor. She was coded for approximatetly 36 minutes and continued to be in asystole. She was unable to be resuscitated and was pronounced dead at 0439.    The rest of her hospital course is listed below, by problem:  #CHF exacerbation: [name] is a 58 y.o. female who presented with CHF exacerbation. Patient had echo 1 week prior to admission with EF 65-70%. She recently started work up for worsening heart failure and started lasix 1 month prior to admission. Patient reported a baseline weight of 290 pounds, but was 310 pounds on initial visit to the [facility] last month. On admission, she had an oxygen requirement of 3L [place]. She had no crackles on exam, but did have 2+ pitting edema in her lower extremities. CXR on admission was clear and troponins were negative. We started aggressive diuresis with IV lasix, which the patient tolerated well. She diuresed approximately 8L and was down to near her dry weight. She was eventually transitioned oral torsemide and remained euvolemic. We additionally continued her metoprolol and aspirin.  #Hypoxia  likely secondary HCAP. Patient developed fever in the setting of a CXR concerning for atelectasis vs infiltrates. Patient was started on vancomycin and zosyn. Blood cultures were obtained which showed no growth. She continued to be intermittently febrile and have an oxygen requirement, thus a CTA was obtained. No showed no PE was seen on CTA, however it did show atelectasis vs infiltrates and mild interstitial edema.  #Elevated transaminases: Elevated to AST 63 ALT 69 on admission. Likely secondary to hepatic congestion due to acute CHF exacerbation. Hepatojugular reflex was not able to be assessed given the patient's habitus. NAFLD may also be contributing. HCV antibody was negative. Her abdominal exam was completely benign and she did not complain of any abdominal pain. Transaminases improved with diuresis and trended back to normal levels.   #Hypertension: On maxide and amlodipine at home. Reported stopping lisinopril due to changing in kidney function. While here, we stopped her maxide and restarted lisinopril and HCTZ. We additionally stopped her amlodipine as it may be contributing to her LE edema.   #Type 2 diabetes: Last hemoglobin A1c of 8.3. Home dose of injecting 95 units of Lantus 2 times daily with also meal coverage of 20 units 3 times a day. Patient had CBG to 81 on admission, thus Lantus was added back slowly. Patient was also placed on SSI while here.   #Hyperlipidemia: Patient was continued on home Lipitor.  #Asthma, restrictive lung disease: Continued patient's home flovent and albuterol as needed.  #Morbid obesity: BMI of 56.6. Baseline of 290 pounds.  #OSA: Continued CPAP at night.    Dimas Chyle, MD 05/09/2014, 12:44 PM PGY-1, [place]

## 2014-05-24 NOTE — Progress Notes (Signed)
Attempt to contact pts. Son Kinda Pottle and Kingsley Callander made. No answer when contacted at numbers listed.

## 2014-05-24 NOTE — Code Documentation (Signed)
  Patient Name: [name]   MRN: [id number]   Date of Birth/ Sex: 05-07-1956 , female      Admission Date: 05/02/2014  Attending Provider: Lupita Dawn, MD  Primary Diagnosis: Hypoxia [799.02] Congestive heart failure, unspecified congestive heart failure chronicity, unspecified congestive heart failure type [428.0]   Indication: Pt was in her usual state of health until this PM, when she sat up on the edge of the bed and called nursing staff, they responded and she fell back to the bed and was noted to be pulseless . Code blue was subsequently called. At the time of arrival on scene, ACLS protocol was underway.  Cardiac monitor showed asystole.   Technical Description:  - CPR performance duration:  36  minutes  - Was defibrillation or cardioversion used? No   - Was external pacer placed? No  - Was patient intubated pre/post CPR? Yes, patient noted to have copus amounts of blood in airway which made intubation difficult but was ultimately successfully intubated..   Medications Administered: Y = Yes; Blank = No Amiodarone    Atropine    Calcium  y  Epinephrine  y  Lidocaine    Magnesium    Norepinephrine    Phenylephrine    Sodium bicarbonate  y  Vasopressin    Other    Post CPR evaluation:  - Final Status - Was patient successfully resuscitated ? No   Miscellaneous Information:  - Time of death:  4:39 AM  - Primary team notified?  Yes  - Family Notified? No, unable to reach by telephone.     Lucious Groves, DO   May 28, 2014, 5:03 AM

## 2014-05-24 NOTE — Progress Notes (Signed)
05/25/14 1400  Clinical Encounter Type  Visited With Family  Visit Type Death  Referral From (Administrative Coverage)  Spiritual Encounters  Spiritual Needs Emotional;Grief support  Stress Factors  Family Stress Factors Lack of knowledge;Loss;Loss of control;Major life changes   Chaplain was paged by the [facility] on duty as additional support for consultation with the patient's family. Several family members were present during this meeting. Family was coping with feelings of anger and sadness and one family member expressed the need for peace. [facility] consulted with the family regarding the Medical Examiner's recommendation that family not visit their loved one in the morgue. Family was upset by this decision. AC and the family conversed about other details related to patient's recent death. Chaplain provided affirmation of the family's feelings. Chaplain will continue to provide spiritual and emotional support as needed. Davyn Elsasser, Claudius Sis, Chaplain

## 2014-05-24 DEATH — deceased

## 2014-09-24 DIAGNOSIS — I82409 Acute embolism and thrombosis of unspecified deep veins of unspecified lower extremity: Secondary | ICD-10-CM

## 2014-09-24 HISTORY — DX: Acute embolism and thrombosis of unspecified deep veins of unspecified lower extremity: I82.409

## 2014-12-03 IMAGING — CR DG CHEST 2V
2 series · 2 of 2 positions shown · non-contrast
Comparison: 04/29/2014; 04/05/2014; 07/30/2013

CLINICAL DATA: CHF.  Shortness of breath.  Dyspnea at rest.

EXAM:
CHEST  2 VIEW

[w chest pa]
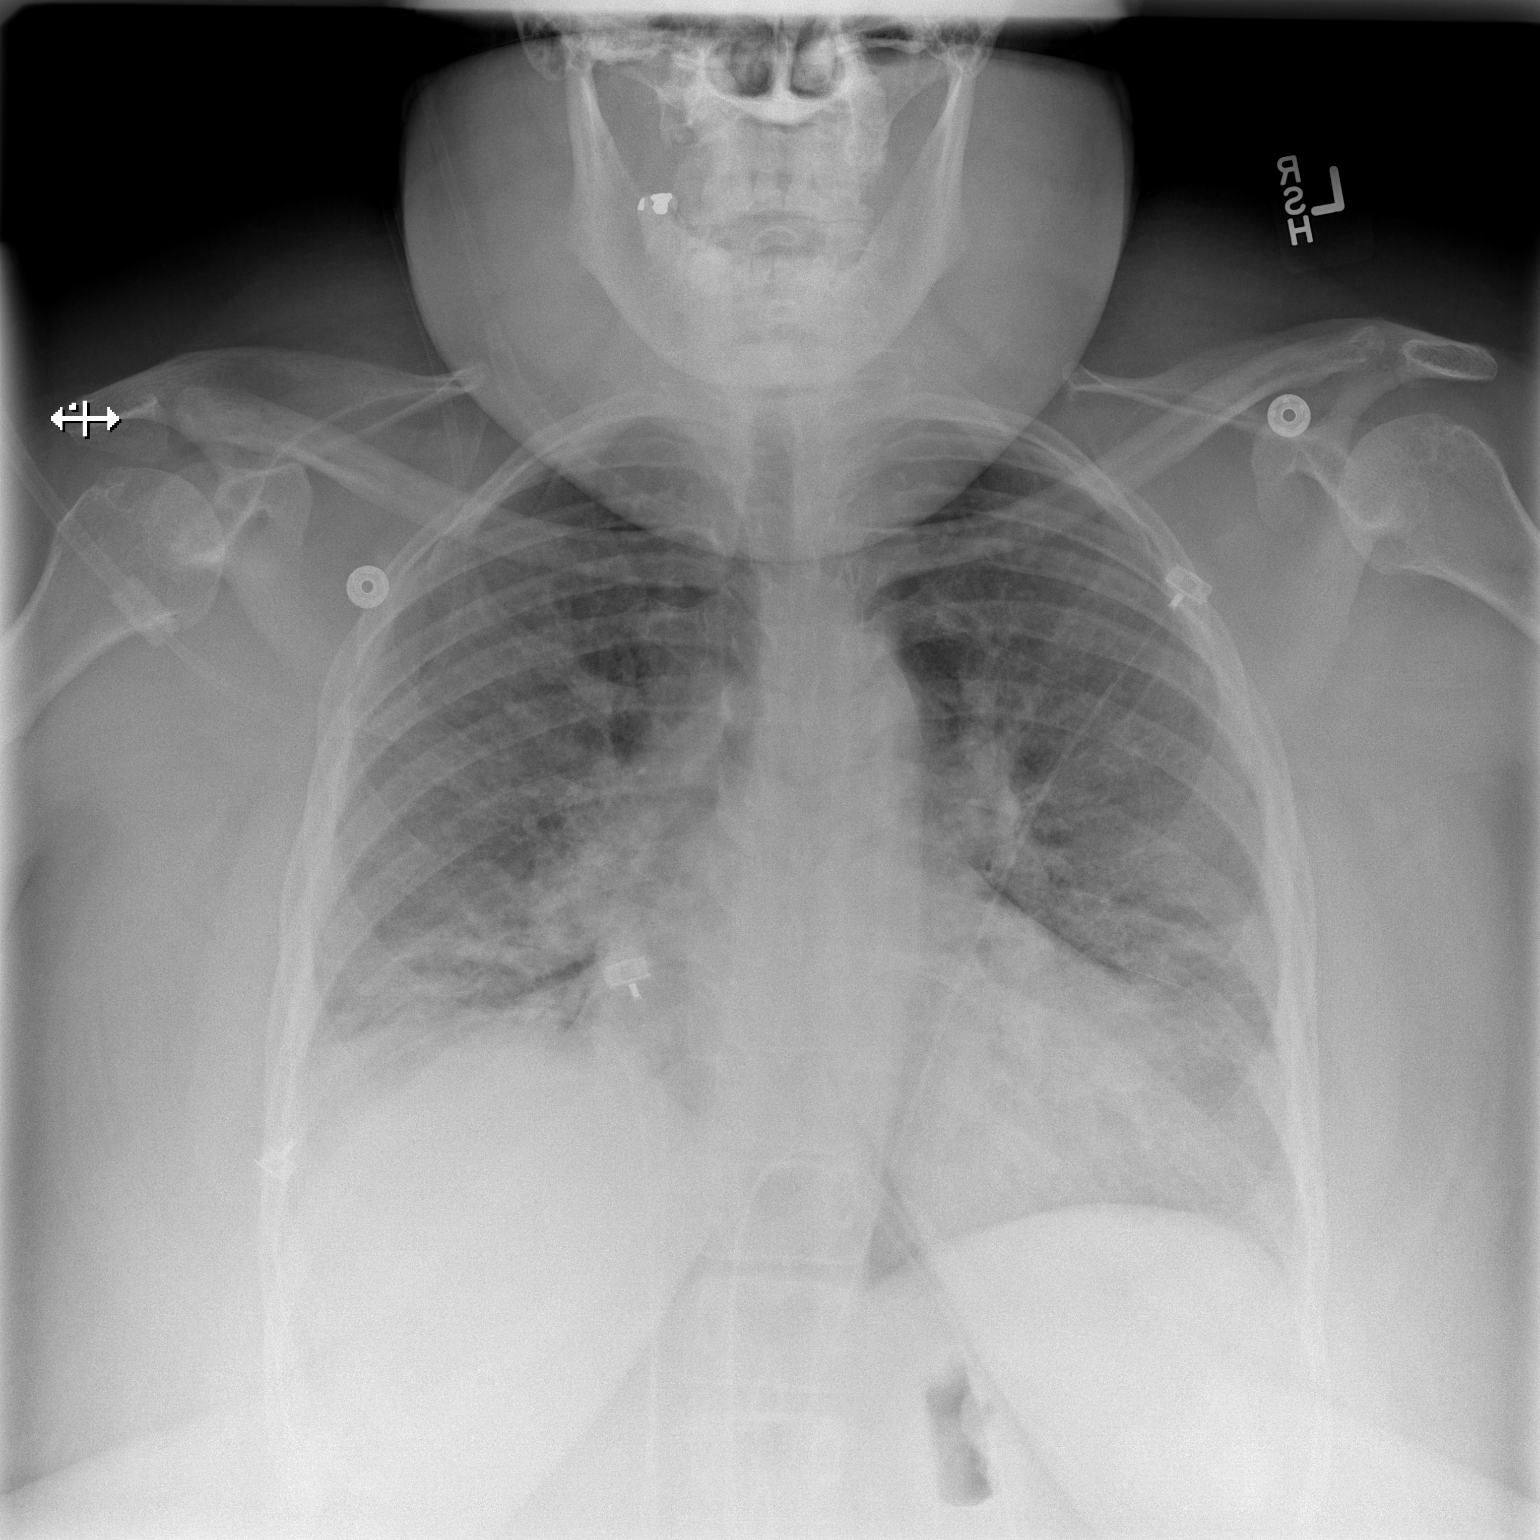

[w chest lat]
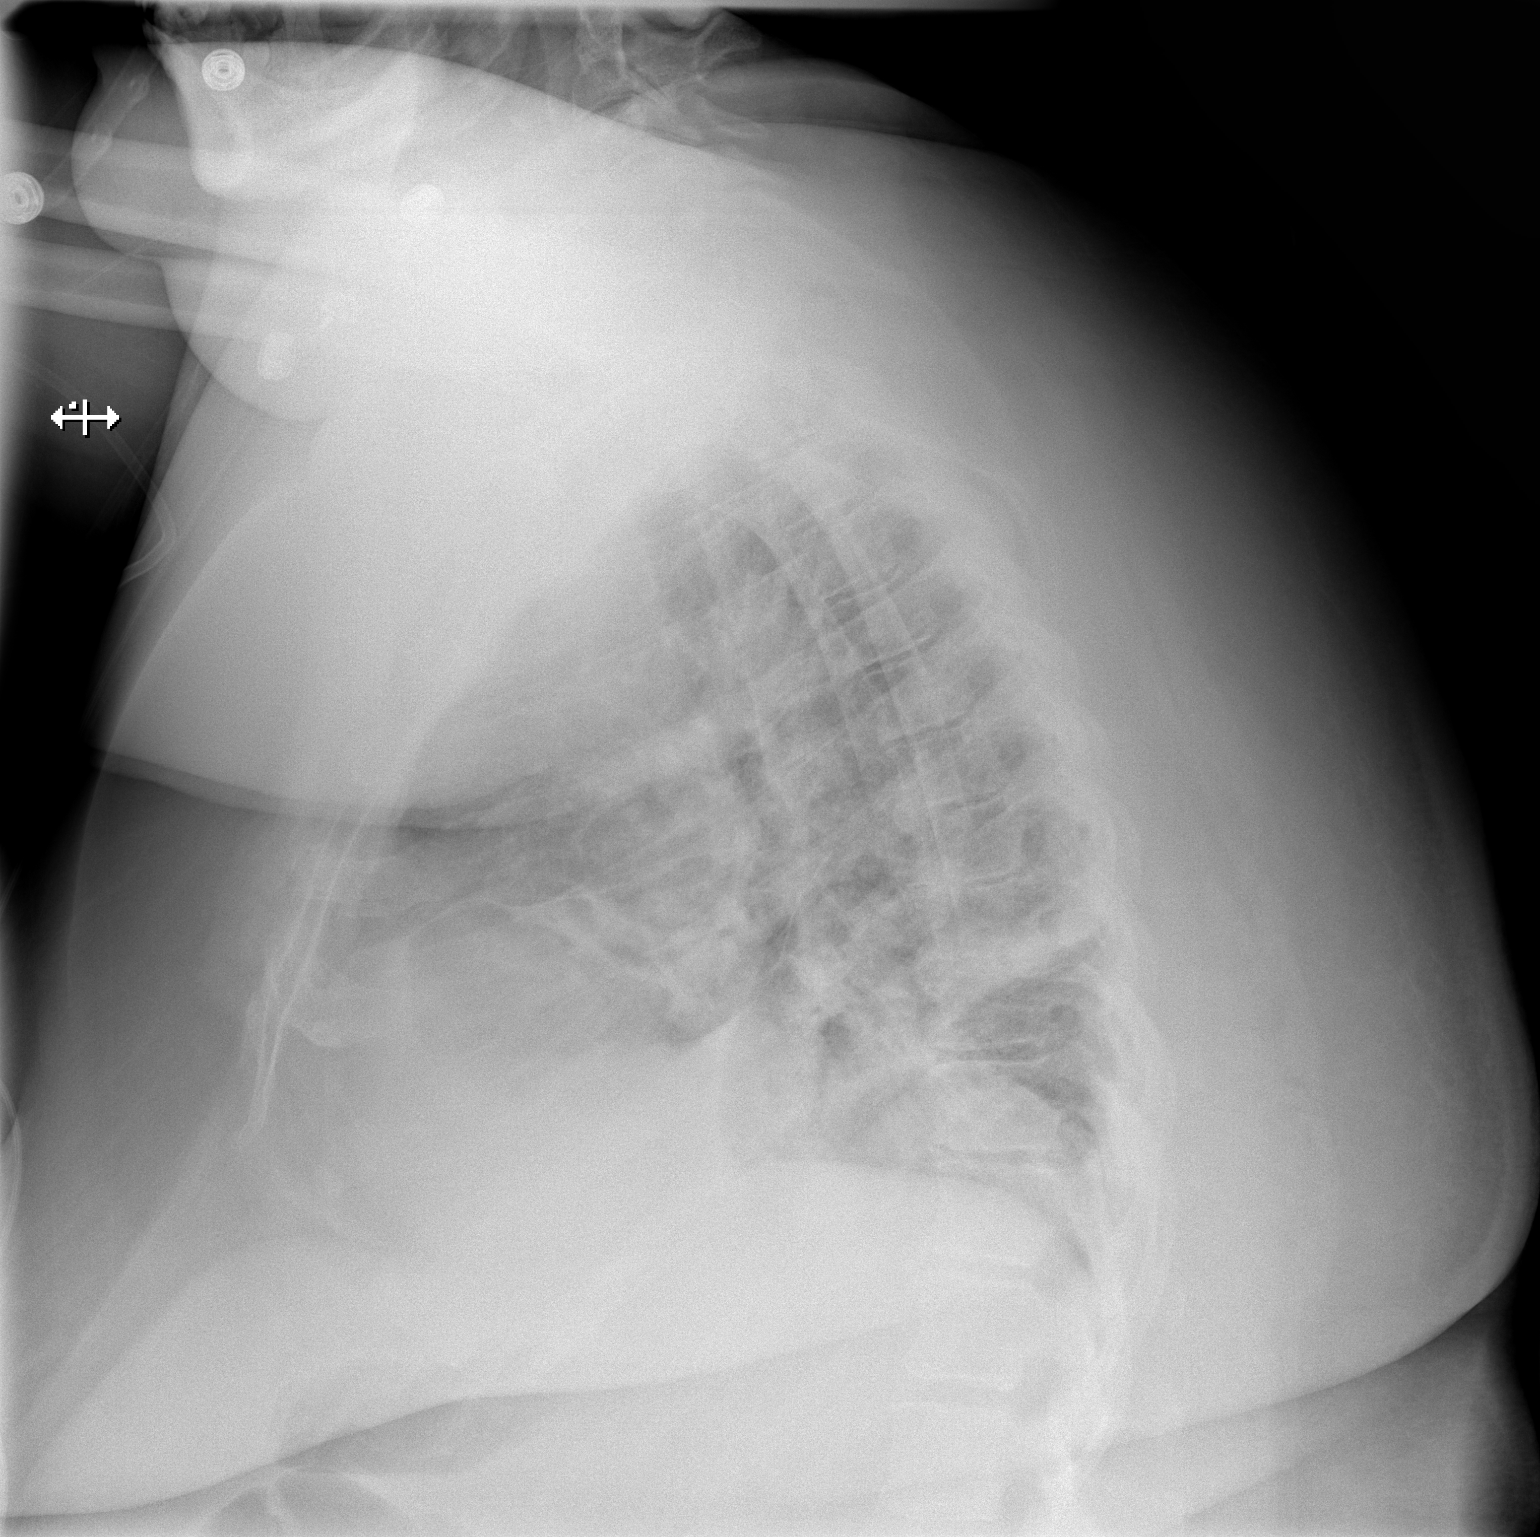

[2 of 2 positions shown; findings below may reference images not displayed]

FINDINGS: The examination is degraded due to patient body habitus.

Grossly unchanged enlarged cardiac silhouette and mediastinal
contours. The pulmonary vasculature remains indistinct with
cephalization of flow. No change to slight worsening of bibasilar
heterogeneous airspace opacities, right greater than left. Trace
bilateral effusions are not excluded. No pneumothorax. Unchanged
bones.
IMPRESSION: 1. Grossly unchanged findings of pulmonary edema and suspected trace
bilateral effusions.
2. No change to slight worsening of bibasilar opacities, right
greater than left, atelectasis versus infiltrate.
